# Patient Record
Sex: Female | Born: 1935 | Race: White | Hispanic: No | Smoking: Former smoker
Health system: Southern US, Community
[De-identification: ages and names within clinical notes are randomized; demographics above are authoritative.]

## PROBLEM LIST (undated history)

## (undated) DIAGNOSIS — Z860100 Personal history of colon polyps, unspecified: Secondary | ICD-10-CM

## (undated) DIAGNOSIS — M4800 Spinal stenosis, site unspecified: Secondary | ICD-10-CM

## (undated) DIAGNOSIS — C801 Malignant (primary) neoplasm, unspecified: Secondary | ICD-10-CM

## (undated) DIAGNOSIS — Z66 Do not resuscitate: Secondary | ICD-10-CM

## (undated) DIAGNOSIS — E538 Deficiency of other specified B group vitamins: Secondary | ICD-10-CM

## (undated) DIAGNOSIS — I10 Essential (primary) hypertension: Secondary | ICD-10-CM

## (undated) DIAGNOSIS — I77810 Thoracic aortic ectasia: Secondary | ICD-10-CM

## (undated) HISTORY — DX: Malignant (primary) neoplasm, unspecified: C80.1

## (undated) HISTORY — DX: Personal history of colon polyps, unspecified: Z86.0100

## (undated) HISTORY — PX: EYE SURGERY: SHX253

## (undated) HISTORY — PX: SPINE SURGERY: SHX786

## (undated) HISTORY — DX: Essential (primary) hypertension: I10

## (undated) HISTORY — DX: Do not resuscitate: Z66

## (undated) HISTORY — DX: Thoracic aortic ectasia: I77.810

## (undated) HISTORY — DX: Personal history of colonic polyps: Z86.010

## (undated) HISTORY — DX: Spinal stenosis, site unspecified: M48.00

## (undated) HISTORY — DX: Deficiency of other specified B group vitamins: E53.8

---
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
## 1968-09-09 HISTORY — PX: BUNIONECTOMY: SHX129

## 1989-09-09 HISTORY — PX: BREAST SURGERY: SHX581

## 2004-09-09 DIAGNOSIS — I10 Essential (primary) hypertension: Secondary | ICD-10-CM

## 2004-09-09 HISTORY — DX: Essential (primary) hypertension: I10

## 2006-08-13 ENCOUNTER — Ambulatory Visit: Payer: Self-pay | Admitting: Ophthalmology

## 2006-08-20 ENCOUNTER — Ambulatory Visit: Payer: Self-pay | Admitting: Ophthalmology

## 2007-02-25 DIAGNOSIS — G2581 Restless legs syndrome: Secondary | ICD-10-CM

## 2007-03-10 ENCOUNTER — Ambulatory Visit: Payer: Self-pay | Admitting: Internal Medicine

## 2007-03-17 ENCOUNTER — Ambulatory Visit: Payer: Self-pay | Admitting: Internal Medicine

## 2007-03-23 ENCOUNTER — Encounter (INDEPENDENT_AMBULATORY_CARE_PROVIDER_SITE_OTHER): Payer: Self-pay | Admitting: *Deleted

## 2007-03-30 ENCOUNTER — Emergency Department: Payer: Self-pay | Admitting: Emergency Medicine

## 2007-04-10 ENCOUNTER — Ambulatory Visit: Payer: Self-pay | Admitting: Internal Medicine

## 2007-04-14 ENCOUNTER — Ambulatory Visit: Payer: Self-pay | Admitting: Internal Medicine

## 2007-04-14 DIAGNOSIS — Z853 Personal history of malignant neoplasm of breast: Secondary | ICD-10-CM

## 2007-04-14 DIAGNOSIS — D518 Other vitamin B12 deficiency anemias: Secondary | ICD-10-CM | POA: Insufficient documentation

## 2007-04-14 DIAGNOSIS — I10 Essential (primary) hypertension: Secondary | ICD-10-CM | POA: Insufficient documentation

## 2007-04-14 DIAGNOSIS — Z860100 Personal history of colon polyps, unspecified: Secondary | ICD-10-CM | POA: Insufficient documentation

## 2007-04-19 DIAGNOSIS — R32 Unspecified urinary incontinence: Secondary | ICD-10-CM | POA: Insufficient documentation

## 2007-04-19 DIAGNOSIS — H409 Unspecified glaucoma: Secondary | ICD-10-CM | POA: Insufficient documentation

## 2007-04-22 ENCOUNTER — Telehealth (INDEPENDENT_AMBULATORY_CARE_PROVIDER_SITE_OTHER): Payer: Self-pay | Admitting: *Deleted

## 2007-04-23 ENCOUNTER — Telehealth (INDEPENDENT_AMBULATORY_CARE_PROVIDER_SITE_OTHER): Payer: Self-pay | Admitting: *Deleted

## 2007-04-29 ENCOUNTER — Telehealth (INDEPENDENT_AMBULATORY_CARE_PROVIDER_SITE_OTHER): Payer: Self-pay | Admitting: *Deleted

## 2007-04-30 ENCOUNTER — Encounter: Payer: Self-pay | Admitting: Internal Medicine

## 2007-05-06 ENCOUNTER — Encounter (INDEPENDENT_AMBULATORY_CARE_PROVIDER_SITE_OTHER): Payer: Self-pay | Admitting: *Deleted

## 2007-05-11 ENCOUNTER — Ambulatory Visit: Payer: Self-pay | Admitting: Internal Medicine

## 2007-05-12 ENCOUNTER — Encounter: Payer: Self-pay | Admitting: Internal Medicine

## 2007-05-15 ENCOUNTER — Telehealth (INDEPENDENT_AMBULATORY_CARE_PROVIDER_SITE_OTHER): Payer: Self-pay | Admitting: *Deleted

## 2007-05-15 ENCOUNTER — Encounter: Payer: Self-pay | Admitting: Internal Medicine

## 2007-05-20 ENCOUNTER — Ambulatory Visit: Payer: Self-pay | Admitting: *Deleted

## 2007-05-20 ENCOUNTER — Encounter: Payer: Self-pay | Admitting: Internal Medicine

## 2007-05-27 ENCOUNTER — Telehealth (INDEPENDENT_AMBULATORY_CARE_PROVIDER_SITE_OTHER): Payer: Self-pay | Admitting: *Deleted

## 2007-05-28 ENCOUNTER — Encounter: Payer: Self-pay | Admitting: Internal Medicine

## 2007-05-28 ENCOUNTER — Telehealth (INDEPENDENT_AMBULATORY_CARE_PROVIDER_SITE_OTHER): Payer: Self-pay | Admitting: *Deleted

## 2007-06-25 ENCOUNTER — Encounter: Payer: Self-pay | Admitting: Internal Medicine

## 2007-07-11 ENCOUNTER — Ambulatory Visit: Payer: Self-pay | Admitting: Internal Medicine

## 2007-07-22 ENCOUNTER — Ambulatory Visit: Payer: Self-pay | Admitting: *Deleted

## 2007-07-28 ENCOUNTER — Ambulatory Visit: Payer: Self-pay | Admitting: Internal Medicine

## 2007-07-28 ENCOUNTER — Encounter (INDEPENDENT_AMBULATORY_CARE_PROVIDER_SITE_OTHER): Payer: Self-pay | Admitting: *Deleted

## 2007-08-10 ENCOUNTER — Ambulatory Visit: Payer: Self-pay | Admitting: Internal Medicine

## 2007-08-17 ENCOUNTER — Encounter: Payer: Self-pay | Admitting: Internal Medicine

## 2007-10-21 ENCOUNTER — Ambulatory Visit: Payer: Self-pay | Admitting: Internal Medicine

## 2007-10-24 LAB — CONVERTED CEMR LAB: OCCULT 3: NEGATIVE

## 2007-10-26 LAB — CONVERTED CEMR LAB: OCCULT 2: NEGATIVE

## 2007-10-29 ENCOUNTER — Ambulatory Visit: Payer: Self-pay | Admitting: Internal Medicine

## 2007-12-17 ENCOUNTER — Telehealth (INDEPENDENT_AMBULATORY_CARE_PROVIDER_SITE_OTHER): Payer: Self-pay | Admitting: *Deleted

## 2008-02-18 ENCOUNTER — Ambulatory Visit: Payer: Self-pay | Admitting: Family Medicine

## 2008-02-18 DIAGNOSIS — M79609 Pain in unspecified limb: Secondary | ICD-10-CM

## 2008-05-10 DIAGNOSIS — M4800 Spinal stenosis, site unspecified: Secondary | ICD-10-CM

## 2008-05-10 HISTORY — DX: Spinal stenosis, site unspecified: M48.00

## 2008-05-13 ENCOUNTER — Encounter: Payer: Self-pay | Admitting: Family Medicine

## 2008-05-13 ENCOUNTER — Inpatient Hospital Stay: Payer: Self-pay | Admitting: Internal Medicine

## 2008-05-14 ENCOUNTER — Encounter: Payer: Self-pay | Admitting: Family Medicine

## 2008-05-15 ENCOUNTER — Encounter: Payer: Self-pay | Admitting: Family Medicine

## 2008-05-17 ENCOUNTER — Telehealth: Payer: Self-pay | Admitting: Family Medicine

## 2008-05-17 ENCOUNTER — Ambulatory Visit: Payer: Self-pay | Admitting: Family Medicine

## 2008-05-17 DIAGNOSIS — M4800 Spinal stenosis, site unspecified: Secondary | ICD-10-CM

## 2008-05-17 DIAGNOSIS — N259 Disorder resulting from impaired renal tubular function, unspecified: Secondary | ICD-10-CM | POA: Insufficient documentation

## 2008-05-17 LAB — CONVERTED CEMR LAB
Basophils Absolute: 0 10*3/uL (ref 0.0–0.1)
Basophils Relative: 0.3 % (ref 0.0–3.0)
CO2: 21 meq/L (ref 19–32)
Casts: 0 /lpf
Chloride: 108 meq/L (ref 96–112)
Creatinine, Ser: 2 mg/dL — ABNORMAL HIGH (ref 0.4–1.2)
Eosinophils Absolute: 0.1 10*3/uL (ref 0.0–0.7)
GFR calc Af Amer: 31 mL/min
GFR calc non Af Amer: 25 mL/min
Lymphocytes Relative: 4.6 % — ABNORMAL LOW (ref 12.0–46.0)
MCHC: 35.1 g/dL (ref 30.0–36.0)
Neutrophils Relative %: 84.8 % — ABNORMAL HIGH (ref 43.0–77.0)
Nitrite: NEGATIVE
Phosphorus: 4.6 mg/dL (ref 2.3–4.6)
Platelets: 185 10*3/uL (ref 150–400)
Potassium: 3.3 meq/L — ABNORMAL LOW (ref 3.5–5.1)
RBC: 3.53 M/uL — ABNORMAL LOW (ref 3.87–5.11)
Sodium: 140 meq/L (ref 135–145)
Specific Gravity, Urine: 1.015
Urine crystals, microscopic: 0 /hpf
Urobilinogen, UA: 0.2

## 2008-05-18 ENCOUNTER — Encounter: Payer: Self-pay | Admitting: Family Medicine

## 2008-05-18 DIAGNOSIS — M899 Disorder of bone, unspecified: Secondary | ICD-10-CM | POA: Insufficient documentation

## 2008-05-18 DIAGNOSIS — M949 Disorder of cartilage, unspecified: Secondary | ICD-10-CM

## 2008-05-23 ENCOUNTER — Inpatient Hospital Stay: Payer: Self-pay | Admitting: Internal Medicine

## 2008-05-23 ENCOUNTER — Telehealth: Payer: Self-pay | Admitting: Family Medicine

## 2008-05-30 ENCOUNTER — Encounter: Payer: Self-pay | Admitting: Family Medicine

## 2008-06-18 ENCOUNTER — Ambulatory Visit: Payer: Self-pay | Admitting: Family Medicine

## 2008-06-22 ENCOUNTER — Ambulatory Visit: Payer: Self-pay | Admitting: Pain Medicine

## 2008-06-30 ENCOUNTER — Encounter: Payer: Self-pay | Admitting: Family Medicine

## 2008-06-30 ENCOUNTER — Ambulatory Visit: Payer: Self-pay | Admitting: Pain Medicine

## 2008-07-01 ENCOUNTER — Telehealth: Payer: Self-pay | Admitting: Family Medicine

## 2008-07-07 ENCOUNTER — Ambulatory Visit: Payer: Self-pay | Admitting: Family Medicine

## 2008-07-11 ENCOUNTER — Telehealth: Payer: Self-pay | Admitting: Family Medicine

## 2008-07-14 ENCOUNTER — Telehealth: Payer: Self-pay | Admitting: Family Medicine

## 2008-07-15 ENCOUNTER — Telehealth: Payer: Self-pay | Admitting: Family Medicine

## 2008-07-18 ENCOUNTER — Telehealth: Payer: Self-pay | Admitting: Family Medicine

## 2008-07-20 ENCOUNTER — Ambulatory Visit: Payer: Self-pay | Admitting: Family Medicine

## 2008-07-22 ENCOUNTER — Encounter: Payer: Self-pay | Admitting: Family Medicine

## 2008-07-25 ENCOUNTER — Ambulatory Visit: Payer: Self-pay | Admitting: Physician Assistant

## 2008-08-08 ENCOUNTER — Telehealth: Payer: Self-pay | Admitting: Family Medicine

## 2008-08-23 ENCOUNTER — Telehealth: Payer: Self-pay | Admitting: Family Medicine

## 2008-08-30 ENCOUNTER — Telehealth: Payer: Self-pay | Admitting: Family Medicine

## 2008-09-13 ENCOUNTER — Encounter: Payer: Self-pay | Admitting: Family Medicine

## 2008-09-14 ENCOUNTER — Telehealth: Payer: Self-pay | Admitting: Family Medicine

## 2008-09-27 ENCOUNTER — Telehealth: Payer: Self-pay | Admitting: Family Medicine

## 2008-09-27 ENCOUNTER — Ambulatory Visit: Payer: Self-pay | Admitting: Family Medicine

## 2008-09-30 ENCOUNTER — Telehealth: Payer: Self-pay | Admitting: Family Medicine

## 2008-10-21 ENCOUNTER — Telehealth: Payer: Self-pay | Admitting: Family Medicine

## 2008-11-03 ENCOUNTER — Encounter (INDEPENDENT_AMBULATORY_CARE_PROVIDER_SITE_OTHER): Payer: Self-pay | Admitting: *Deleted

## 2008-11-11 ENCOUNTER — Telehealth: Payer: Self-pay | Admitting: Family Medicine

## 2008-11-14 ENCOUNTER — Encounter: Payer: Self-pay | Admitting: Family Medicine

## 2008-11-18 ENCOUNTER — Encounter: Payer: Self-pay | Admitting: Family Medicine

## 2008-11-28 ENCOUNTER — Ambulatory Visit: Payer: Self-pay | Admitting: Family Medicine

## 2008-11-29 ENCOUNTER — Telehealth: Payer: Self-pay | Admitting: Family Medicine

## 2008-12-05 ENCOUNTER — Telehealth: Payer: Self-pay | Admitting: Family Medicine

## 2008-12-16 ENCOUNTER — Telehealth: Payer: Self-pay | Admitting: Family Medicine

## 2008-12-22 ENCOUNTER — Encounter (INDEPENDENT_AMBULATORY_CARE_PROVIDER_SITE_OTHER): Payer: Self-pay | Admitting: *Deleted

## 2008-12-24 ENCOUNTER — Encounter: Payer: Self-pay | Admitting: Family Medicine

## 2008-12-28 ENCOUNTER — Ambulatory Visit: Payer: Self-pay | Admitting: Family Medicine

## 2008-12-28 LAB — CONVERTED CEMR LAB
AST: 21 units/L
Albumin: 40 g/dL
Alkaline Phosphatase: 98 units/L
BUN: 36 mg/dL
CO2: 19 meq/L
Cholesterol: 183 mg/dL
Glucose, Bld: 88 mg/dL
HDL: 73 mg/dL
Triglycerides: 60 mg/dL

## 2009-01-17 ENCOUNTER — Telehealth: Payer: Self-pay | Admitting: Family Medicine

## 2009-01-18 ENCOUNTER — Ambulatory Visit: Payer: Self-pay | Admitting: Family Medicine

## 2009-01-27 ENCOUNTER — Telehealth: Payer: Self-pay | Admitting: Family Medicine

## 2009-01-27 ENCOUNTER — Ambulatory Visit: Payer: Self-pay | Admitting: Family Medicine

## 2009-02-03 ENCOUNTER — Telehealth: Payer: Self-pay | Admitting: Family Medicine

## 2009-02-07 ENCOUNTER — Telehealth: Payer: Self-pay | Admitting: Family Medicine

## 2009-02-21 ENCOUNTER — Telehealth: Payer: Self-pay | Admitting: Family Medicine

## 2009-02-27 ENCOUNTER — Encounter: Payer: Self-pay | Admitting: Family Medicine

## 2009-03-06 ENCOUNTER — Ambulatory Visit: Payer: Self-pay | Admitting: Family Medicine

## 2009-03-06 LAB — CONVERTED CEMR LAB
Ketones, urine, test strip: NEGATIVE
Nitrite: POSITIVE
Protein, U semiquant: 30
Specific Gravity, Urine: 1.02
pH: 6

## 2009-03-10 LAB — CONVERTED CEMR LAB
ALT: 12 units/L (ref 0–35)
AST: 18 units/L (ref 0–37)
Alkaline Phosphatase: 64 units/L (ref 39–117)
Basophils Relative: 0.8 % (ref 0.0–3.0)
Bilirubin, Direct: 0.1 mg/dL (ref 0.0–0.3)
Chloride: 113 meq/L — ABNORMAL HIGH (ref 96–112)
Eosinophils Absolute: 0.2 10*3/uL (ref 0.0–0.7)
GFR calc non Af Amer: 45.67 mL/min (ref >60)
Lymphocytes Relative: 20.4 % (ref 12.0–46.0)
MCHC: 34.1 g/dL (ref 30.0–36.0)
MCV: 88.5 fL (ref 78.0–100.0)
Monocytes Absolute: 0.6 10*3/uL (ref 0.1–1.0)
Neutrophils Relative %: 66.6 % (ref 43.0–77.0)
Platelets: 164 10*3/uL (ref 150.0–400.0)
Potassium: 4.6 meq/L (ref 3.5–5.1)
RBC: 3.64 M/uL — ABNORMAL LOW (ref 3.87–5.11)
Sodium: 146 meq/L — ABNORMAL HIGH (ref 135–145)
Total Bilirubin: 0.9 mg/dL (ref 0.3–1.2)
WBC: 6.8 10*3/uL (ref 4.5–10.5)

## 2009-03-11 ENCOUNTER — Encounter: Payer: Self-pay | Admitting: Family Medicine

## 2009-03-15 ENCOUNTER — Encounter: Payer: Self-pay | Admitting: Family Medicine

## 2009-03-28 ENCOUNTER — Telehealth: Payer: Self-pay | Admitting: Family Medicine

## 2009-04-04 ENCOUNTER — Ambulatory Visit: Payer: Self-pay | Admitting: Family Medicine

## 2009-04-05 ENCOUNTER — Encounter: Payer: Self-pay | Admitting: Family Medicine

## 2009-04-06 ENCOUNTER — Telehealth: Payer: Self-pay | Admitting: Family Medicine

## 2009-04-07 ENCOUNTER — Telehealth: Payer: Self-pay | Admitting: Family Medicine

## 2009-04-10 ENCOUNTER — Encounter: Payer: Self-pay | Admitting: Family Medicine

## 2009-04-12 ENCOUNTER — Telehealth: Payer: Self-pay | Admitting: Family Medicine

## 2009-05-19 ENCOUNTER — Encounter: Payer: Self-pay | Admitting: Family Medicine

## 2009-05-26 ENCOUNTER — Encounter: Payer: Self-pay | Admitting: Family Medicine

## 2009-07-06 ENCOUNTER — Ambulatory Visit: Payer: Self-pay | Admitting: Family Medicine

## 2009-10-23 ENCOUNTER — Telehealth: Payer: Self-pay | Admitting: Family Medicine

## 2010-02-09 ENCOUNTER — Ambulatory Visit: Payer: Self-pay | Admitting: Family Medicine

## 2010-02-09 DIAGNOSIS — J309 Allergic rhinitis, unspecified: Secondary | ICD-10-CM | POA: Insufficient documentation

## 2010-02-09 LAB — CONVERTED CEMR LAB
ALT: 11 units/L (ref 0–35)
AST: 14 units/L (ref 0–37)
Albumin: 3.9 g/dL (ref 3.5–5.2)
Alkaline Phosphatase: 56 units/L (ref 39–117)
BUN: 34 mg/dL — ABNORMAL HIGH (ref 6–23)
CO2: 25 meq/L (ref 19–32)
Chloride: 114 meq/L — ABNORMAL HIGH (ref 96–112)
GFR calc non Af Amer: 38.78 mL/min (ref >60)
Glucose, Bld: 88 mg/dL (ref 70–99)
Potassium: 4.8 meq/L (ref 3.5–5.1)
Sodium: 146 meq/L — ABNORMAL HIGH (ref 135–145)

## 2010-02-13 DIAGNOSIS — E559 Vitamin D deficiency, unspecified: Secondary | ICD-10-CM | POA: Insufficient documentation

## 2010-05-23 ENCOUNTER — Ambulatory Visit: Payer: Self-pay | Admitting: Family Medicine

## 2010-07-25 ENCOUNTER — Telehealth: Payer: Self-pay | Admitting: Family Medicine

## 2010-08-10 ENCOUNTER — Ambulatory Visit: Payer: Self-pay | Admitting: Family Medicine

## 2010-10-09 ENCOUNTER — Encounter: Payer: Self-pay | Admitting: Family Medicine

## 2010-10-10 NOTE — Progress Notes (Signed)
Summary: Rx Lomotil  Phone Note Refill Request Call back at [phone] Message from:  Ashley Conner on October 23, 2009 9:14 AM  Refills Requested: Medication #1:  LOMOTIL 2.5-0.025 MG TABS 1 tab daily as needed diarrhea. Limit use as much as possible.   Last Refilled: 09/05/2008 Received faxed refill request, spoke with patient and she says she does need this refilled.  Please advise   Method Requested: Electronic Initial call taken by: Linde Gillis CMA Duncan Dull),  October 23, 2009 9:15 AM  Follow-up for Phone Call        Make sure patient knows that this needs to be used in limited fashion. If diarrhea continuing...needs to be seen.  Follow-up by: Kerby Nora MD,  October 23, 2009 11:00 AM  Additional Follow-up for Phone Call Additional follow up Details #1::        rx called to pharmacy Additional Follow-up by: Benny Lennert CMA (AAMA),  October 23, 2009 2:12 PM    Prescriptions: LOMOTIL 2.5-0.025 MG TABS (DIPHENOXYLATE-ATROPINE) 1 tab daily as needed diarrhea. Limit use as much as possible.  #20 x 0   Entered and Authorized by:   Kerby Nora MD   Signed by:   Kerby Nora MD on 10/23/2009   Method used:   Telephoned to ...       [facility] [address]* (retail)       [address] [place], [place]  [address]       Ph: [phone]       Fax: [phone]   RxID:   [id number]

## 2010-10-10 NOTE — Assessment & Plan Note (Signed)
Summary: ?SINUS INFECTION/CLE   Vital Signs:  Patient profile:   76 year old female Weight:      119.25 pounds Temp:     98.2 degrees F oral Pulse rate:   72 / minute Pulse rhythm:   regular BP sitting:   162 / 98  (left arm) Cuff size:   regular  Vitals Entered By: Selena Batten Dance CMA Duncan Dull) (May 23, 2010 11:41 AM) CC: ? sinus infection/right facial swelling   History of Present Illness: CC: sinus infection?  Presents with daughter who helps with history.  2wk h/o sinus drainage.  Also congested.  h/o eustachian tube dysfunction.  Voice deeper, hoarse.  + RN, clear mucous discharge.  Has tried zyrtec.  At night does better.  Aggravated mainly by constant dripping.  No ST, no fevers/chills, HA, pressure pain, no PNdrip per patient.  No cough, SOB. no trouble opening/closing mouth, no drooling.  + viruses running around retirement home.  No smokers at home.  + h/o allergies  Allergies: 1)  ! Penicillin V Potassium (Penicillin V Potassium) 2)  ! Sulfa 3)  ! Codeine Sulfate (Codeine Sulfate) 4)  ! Keflex (Cephalexin)  Past History:  Past Medical History: Last updated: 05/17/2008 Breast cancer, hx of Colonic polyps, hx of Hypertension-2006 Urinary incontinence spinal stenosis with herniated disc 9/09 PMH-FH-SH reviewed for relevance  Review of Systems       per HPI  Physical Exam  General:  elderly female in NAD, kyphosis Head:  Normocephalic and atraumatic without obvious abnormalities. No apparent alopecia or balding.  no sinus tenderness.  no facial swelling. Eyes:  No corneal or conjunctival inflammation noted. EOMI. Perrla. Ears:  clear fluid B TMS Nose:  nasal discharge, mucosal pallor.    Mouth:  MMM, no pharyngeal erythema.  + some PND Neck:  no carotid bruit or thyromegaly no cervical or supraclavicular lymphadenopathy  Lungs:  Normal respiratory effort, chest expands symmetrically. Lungs are clear to auscultation, no crackles or wheezes. Heart:   SEM Pulses:  2+ rad pulses Extremities:  1+ left pedal edema and 2+ right pedal edema.   Skin:  Intact without suspicious lesions or rashes   Impression & Recommendations:  Problem # 1:  OTHER ACUTE SINUSITIS (ICD-461.8) Sounds viral vs vasomotor.  Rec trial of different oral antihistamine and nasal steroid (zyrtec too sedating, flonase not helping) and nasal saline.  Daughter doesn't think would tolerate neti pot.  could try astepro if not improved with these measures.  would need to make sure pt not taking decongestants currently (to r/o medicamentosa).  however as going on for 2 wks and given pt age, treat with course of abx.  The following medications were removed from the medication list:    Fluticasone Propionate 50 Mcg/act Susp (Fluticasone propionate) .Marland Kitchen... 2 sprays per nostril daily Her updated medication list for this problem includes:    Zithromax Z-pak 250 Mg Tabs (Azithromycin) ..... Use as directed    Nasonex 50 Mcg/act Susp (Mometasone furoate) .Marland Kitchen... 2 sprays in each nostril daily, point to ears.  Problem # 2:  ALLERGIC RHINITIS (ICD-477.9) see above. The following medications were removed from the medication list:    Fluticasone Propionate 50 Mcg/act Susp (Fluticasone propionate) .Marland Kitchen... 2 sprays per nostril daily Her updated medication list for this problem includes:    Claritin 10 Mg Tabs (Loratadine) ..... One daily for allergies    Nasonex 50 Mcg/act Susp (Mometasone furoate) .Marland Kitchen... 2 sprays in each nostril daily, point to ears.  Complete Medication List:  1)  Metoprolol Tartrate 25 Mg Tabs (Metoprolol tartrate) .Marland Kitchen.. 1 tab by mouth two times a day 2)  Azopt 1 % Susp (Brinzolamide) .... Three times a day ou 3)  Xalatan 0.005 % Soln (Latanoprost) .... At bedtime ou 4)  Compression Hose 15-30 Mmhg  .... Wear as directed 5)  Gabapentin 100 Mg Caps (Gabapentin) .... Take 1 tablet by mouth every morning 6)  Alendronate Sodium 70 Mg Tabs (Alendronate sodium) .... Take 1 tablet  by mouth once week 7)  Lomotil 2.5-0.025 Mg Tabs (Diphenoxylate-atropine) .Marland Kitchen.. 1 tab daily as needed diarrhea. limit use as much as possible. 8)  Amlodipine Besylate 5 Mg Tabs (Amlodipine besylate) .Marland Kitchen.. 1 tab by mouth daily 9)  Lisinopril 40 Mg Tabs (Lisinopril) .... Take 1/2 tablet by mouth two times a day 10)  Vitamin B-12 1000 Mcg Tabs (Cyanocobalamin) .Marland Kitchen.. 1 tab by mouth daily. 11)  Claritin 10 Mg Tabs (Loratadine) .... One daily for allergies 12)  Vitamin D (ergocalciferol) 50000 Unit Caps (Ergocalciferol) .Marland Kitchen.. 1 tab by mouth weekly x 12 weeks then monthly 13)  Zithromax Z-pak 250 Mg Tabs (Azithromycin) .... Use as directed 14)  Nasonex 50 Mcg/act Susp (Mometasone furoate) .... 2 sprays in each nostril daily, point to ears.  Patient Instructions: 1)  I think you have large amount of congestion, possible early sinus infection. 2)  Treat with course of antibiotics, nasal steroid, and nasal saline drops with plenty of fluids.  Change antihistamine. 3)  Let us know how you are doing. 4)  Please return if you have high fever >101.5, or trouble swallowing or drooling or opening mouth. 5)  Pleasure to meet you today.  Call clinic with quesitons. Prescriptions: NASONEX 50 MCG/ACT SUSP (MOMETASONE FUROATE) 2 sprays in each nostril daily, point to ears.  #1 x 3   Entered and Authorized by:   Eustaquio Boyden  MD   Signed by:   Eustaquio Boyden  MD on 05/23/2010   Method used:   Electronically to        [facility] [address]* (retail)       [address] [place], [place]  [address]       Ph: [phone]       Fax: [phone]   RxID:   [id number] CLARITIN 10 MG TABS (LORATADINE) one daily for allergies  #30 x 3   Entered and Authorized by:   Eustaquio Boyden  MD   Signed by:   Eustaquio Boyden  MD on 05/23/2010   Method used:   Historical   RxID:   [id number] ZITHROMAX Z-PAK 250 MG TABS (AZITHROMYCIN) use as directed  #1 x 0   Entered and  Authorized by:   Eustaquio Boyden  MD   Signed by:   Eustaquio Boyden  MD on 05/23/2010   Method used:   Electronically to        [facility] [address] (retail)       [address] [place], [place]  [address]       Ph: [phone]       Fax: [phone]   RxID:   [id number]   Current Allergies (reviewed today): ! PENICILLIN V POTASSIUM (PENICILLIN V POTASSIUM) ! SULFA ! CODEINE SULFATE (CODEINE SULFATE) ! KEFLEX (CEPHALEXIN)

## 2010-10-10 NOTE — Assessment & Plan Note (Signed)
Summary: F/U/CLE   Vital Signs:  Patient profile:   77 year old female Height:      61 inches Weight:      121.2 pounds BMI:     22.98 Temp:     98.2 degrees F oral Pulse rate:   78 / minute Pulse rhythm:   regular BP sitting:   150 / 80  (left arm) Cuff size:   regular  Vitals Entered By: Benny Lennert CMA Duncan Dull) (February 09, 2010 12:09 PM)  History of Present Illness: Chief complaint follow up  Doing well overall. No current falls.   Congestion..not taking zyrtec regularly.  Ears feel full, no pain.  no fever. No siunus pain.   Hypertension History:      She denies headache, chest pain, palpitations, dyspnea with exertion, orthopnea, peripheral edema, syncope, and side effects from treatment.  Well controlled at home. Marland Kitchen        Positive major cardiovascular risk factors include female age 2 years old or older and hypertension.        Positive history for target organ damage include renal insufficiency.     Problems Prior to Update: 1)  Shoulder Pain, Right  (ICD-719.41) 2)  Osteopenia  (ICD-733.90) 3)  Renal Insufficiency  (ICD-588.9) 4)  Spinal Stenosis  (ICD-724.00) 5)  Leg Pain, Bilateral  (ICD-729.5) 6)  Special Screening Malig Neoplasms Other Sites  (ICD-V76.49) 7)  Need Proph Vacc Against Hemophilus Flu Type B  (ICD-V03.81) 8)  Urinary Incontinence  (ICD-788.30) 9)  Diarrhea  (ICD-787.91) 10)  Weight Loss  (ICD-783.21) 11)  Hay Fever  (ICD-477.0) 12)  Restless Leg Syndrome  (ICD-333.94) 13)  Glaucoma  (ICD-365.9) 14)  Anemia, Vitamin B12 Deficiency Nec  (ICD-281.1) 15)  Hypertension  (ICD-401.9) 16)  Colonic Polyps, Hx of  (ICD-V12.72) 17)  Breast Cancer, Hx of  (ICD-V10.3)  Current Medications (verified): 1)  Metoprolol Tartrate 25 Mg Tabs (Metoprolol Tartrate) .Marland Kitchen.. 1 Tab By Mouth Two Times A Day 2)  Azopt 1 %  Susp (Brinzolamide) .... Three Times A Day Ou 3)  Xalatan 0.005 %  Soln (Latanoprost) .... At Bedtime Ou 4)  Compression Hose  15-30 Mmhg ....  Wear As Directed 5)  Gabapentin 100 Mg Caps (Gabapentin) .... Take 1 Tablet By Mouth Every Morning 6)  Alendronate Sodium 70 Mg Tabs (Alendronate Sodium) .... Take 1 Tablet By Mouth Once Week 7)  Lomotil 2.5-0.025 Mg Tabs (Diphenoxylate-Atropine) .Marland Kitchen.. 1 Tab Daily As Needed Diarrhea. Limit Use As Much As Possible. 8)  Amlodipine Besylate 5 Mg Tabs (Amlodipine Besylate) .Marland Kitchen.. 1 Tab By Mouth Daily 9)  Lisinopril 40 Mg Tabs (Lisinopril) .... Take 1 Tablet By Mouth Once A Day 10)  Vitamin B-12 1000 Mcg Tabs (Cyanocobalamin) .Marland Kitchen.. 1 Tab By Mouth Daily. 11)  Fluticasone Propionate 50 Mcg/act Susp (Fluticasone Propionate) .... 2 Sprays Per Nostril Daily 12)  Zyrtec Hives Relief 10 Mg Tabs (Cetirizine Hcl) .Marland Kitchen.. 1 Tab By Mouth Daily  Allergies: 1)  ! Penicillin V Potassium (Penicillin V Potassium) 2)  ! Sulfa 3)  ! Codeine Sulfate (Codeine Sulfate) 4)  ! Keflex (Cephalexin)  Past History:  Past medical, surgical, family and social histories (including risk factors) reviewed, and no changes noted (except as noted below).  Past Medical History: Reviewed history from 05/17/2008 and no changes required. Breast cancer, hx of Colonic polyps, hx of Hypertension-2006 Urinary incontinence spinal stenosis with herniated disc 9/09  Past Surgical History: Reviewed history from 05/17/2008 and no changes required. Appendectomy- 1937 Cataract  extraction- OS, 2003, OD- 08/2006 Cholecystectomy- 1958 Mastectomy,right- 1991 Tonsillectomy- 1932 Bunionectomy, left- 1970's Tow back surgeries after 1972-74 Exploratory lap, ruptured obturator muscle- 1997 hosp [facility]- spinal stenosis and uti 9/09  Family History: Reviewed history from 04/14/2007 and no changes required. No family history secondary to patient being adopted.  Social History: Reviewed history from 05/17/2008 and no changes required. Occupation:retired teacher non smoker  lives at [facility]   Review of Systems General:   Complains of fatigue; denies fever. CV:  Denies chest pain or discomfort. Resp:  Denies shortness of breath. GI:  Denies abdominal pain. GU:  Denies dysuria.  Physical Exam  General:  elderly female in NAD, kyphosis Head:  no maxillary sinus ttp Eyes:  No corneal or conjunctival inflammation noted. EOMI. Perrla. Funduscopic exam benign, without hemorrhages, exudates or papilledema. Vision grossly normal. Ears:  clear fluid B TMS Nose:  nasal discharge, mucosal pallor.   Decreased hearing B Mouth:  MMM Neck:  no carotid bruit or thyromegaly no cervical or supraclavicular lymphadenopathy  Lungs:  Normal respiratory effort, chest expands symmetrically. Lungs are clear to auscultation, no crackles or wheezes. Heart:  Normal rate and regular rhythm. S1 and S2 normal without gallop, murmur, click, rub or other extra sounds. Abdomen:  Bowel sounds positive,abdomen soft and non-tender without masses, organomegaly or hernias noted. Pulses:  R and L posterior tibial pulses are full and equal bilaterally  Extremities:  1+ left pedal edema and 2+ right pedal edema.   Psych:  Cognition and judgment appear intact. Alert and cooperative with normal attention span and concentration. No apparent delusions, illusions, hallucinations    Impression & Recommendations:  Problem # 1:  ALLERGIC RHINITIS (ICD-477.9) Poor control..eustacian tiube dysfunction. No clear infection. Treat woith oral antihistamines as well as nasl steroid.  Her updated medication list for this problem includes:    Fluticasone Propionate 50 Mcg/act Susp (Fluticasone propionate) .Marland Kitchen... 2 sprays per nostril daily    Zyrtec Hives Relief 10 Mg Tabs (Cetirizine hcl) .Marland Kitchen... 1 tab by mouth daily  Orders: Prescription Created Electronically [phone])  Problem # 2:  OSTEOPENIA (ICD-733.90) Due for reeval. vit D. On alendronate.  Her updated medication list for this problem includes:    Alendronate Sodium 70 Mg Tabs (Alendronate sodium)  .Marland Kitchen... Take 1 tablet by mouth once week  Orders: T-Vitamin D (25-Hydroxy) ([id number]) Specimen Handling (91478)  Problem # 3:  ANEMIA, VITAMIN B12 DEFICIENCY NEC (ICD-281.1) Due for reeval. Trouble taking pill...cut in half. Refuses injection.  Her updated medication list for this problem includes:    Vitamin B-12 1000 Mcg Tabs (Cyanocobalamin) .Marland Kitchen... 1 tab by mouth daily.  Orders: TLB-B12 + Folate Pnl (29562_13086-V78/ION)  Problem # 4:  RENAL INSUFFICIENCY (ICD-588.9) Due for reeval.   Problem # 5:  HYPERTENSION (ICD-401.9) Well controlled. Continue current medication.  Her updated medication list for this problem includes:    Metoprolol Tartrate 25 Mg Tabs (Metoprolol tartrate) .Marland Kitchen... 1 tab by mouth two times a day    Amlodipine Besylate 5 Mg Tabs (Amlodipine besylate) .Marland Kitchen... 1 tab by mouth daily    Lisinopril 40 Mg Tabs (Lisinopril) .Marland Kitchen... Take 1 tablet by mouth once a day  Orders: TLB-BMP (Basic Metabolic Panel-BMET) (80048-METABOL) TLB-Hepatic/Liver Function Pnl (80076-HEPATIC)  Complete Medication List: 1)  Metoprolol Tartrate 25 Mg Tabs (Metoprolol tartrate) .Marland Kitchen.. 1 tab by mouth two times a day 2)  Azopt 1 % Susp (Brinzolamide) .... Three times a day ou 3)  Xalatan 0.005 % Soln (Latanoprost) .... At bedtime  ou 4)  Compression Hose 15-30 Mmhg  .... Wear as directed 5)  Gabapentin 100 Mg Caps (Gabapentin) .... Take 1 tablet by mouth every morning 6)  Alendronate Sodium 70 Mg Tabs (Alendronate sodium) .... Take 1 tablet by mouth once week 7)  Lomotil 2.5-0.025 Mg Tabs (Diphenoxylate-atropine) .Marland Kitchen.. 1 tab daily as needed diarrhea. limit use as much as possible. 8)  Amlodipine Besylate 5 Mg Tabs (Amlodipine besylate) .Marland Kitchen.. 1 tab by mouth daily 9)  Lisinopril 40 Mg Tabs (Lisinopril) .... Take 1 tablet by mouth once a day 10)  Vitamin B-12 1000 Mcg Tabs (Cyanocobalamin) .Marland Kitchen.. 1 tab by mouth daily. 11)  Fluticasone Propionate 50 Mcg/act Susp (Fluticasone propionate) .... 2 sprays per  nostril daily 12)  Zyrtec Hives Relief 10 Mg Tabs (Cetirizine hcl) .Marland Kitchen.. 1 tab by mouth daily  Hypertension Assessment/Plan:      The patient's hypertensive risk group is category C: Target organ damage and/or diabetes.  Her calculated 10 year risk of coronary heart disease is 7 %.  Today's blood pressure is 150/80.  Her blood pressure goal is < 140/90.  Patient Instructions: 1)  Please schedule a follow-up appointment in 3 months 30 min.  Prescriptions: FLUTICASONE PROPIONATE 50 MCG/ACT SUSP (FLUTICASONE PROPIONATE) 2 sprays per nostril daily  #1 x 11   Entered and Authorized by:   Kerby Nora MD   Signed by:   Kerby Nora MD on 02/09/2010   Method used:   Electronically to        [facility]* (retail)       [address] [place], [place]  [address]       Ph: [phone]       Fax: [phone]   RxID:   [id number]   Current Allergies (reviewed today): ! PENICILLIN V POTASSIUM (PENICILLIN V POTASSIUM) ! SULFA ! CODEINE SULFATE (CODEINE SULFATE) ! KEFLEX (CEPHALEXIN)

## 2010-10-10 NOTE — Progress Notes (Signed)
Summary: refill request for gabapentin  Phone Note Refill Request Message from:  Fax from Pharmacy  Refills Requested: Medication #1:  GABAPENTIN 100 MG CAPS Take 1 tablet by mouth every morning   Last Refilled: 05/09/2010 Faxed request from [facility], pt gets quantity of 90.  Initial call taken by: Lowella Petties CMA, AAMA,  July 25, 2010 5:02 PM  Follow-up for Phone Call        filled. latest Cr 1.4 02/2010. Follow-up by: Eustaquio Boyden  MD,  July 25, 2010 5:14 PM    Prescriptions: GABAPENTIN 100 MG CAPS (GABAPENTIN) Take 1 tablet by mouth every morning  #90 x 3   Entered and Authorized by:   Eustaquio Boyden  MD   Signed by:   Eustaquio Boyden  MD on 07/25/2010   Method used:   Electronically to        [facility] (retail)       [address] [place], [place]  [address]       Ph: [phone]       Fax: [phone]   RxID:   [id number]

## 2010-10-11 NOTE — Assessment & Plan Note (Signed)
Summary: F/UP / LFW  R/S FROM 08/09/10   Vital Signs:  Patient profile:   77 year old female Height:      61 inches Weight:      118.8 pounds BMI:     22.53 Temp:     98.1 degrees F oral Pulse rate:   72 / minute Pulse rhythm:   regular BP sitting:   140 / 80  (left arm) Cuff size:   regular  Vitals Entered By: Benny Lennert CMA Duncan Dull) (August 10, 2010 11:48 AM)  History of Present Illness: Chief complaint 6 month follow up  Doing well overall since last OV. No falls.  No body pain.  Walking 1/3 mile every day... gets funny money from NH as reward.     Increase in urinary frequncy...at bedtime... 5-6 times a night....really last night per daughter.. no a long term issue. Usually sleeps well.  Not symptoms during the day. No dysuria.  No fever, no abdominal pain. NOT BOTHERSOME ENOUGH TO WANT TO TAKE ANOTHER MED>  Eat okay.. no depression.   Hypertension History:      She denies chest pain, dyspnea with exertion, and syncope.  Well controlled at home. Marland Kitchen        Positive major cardiovascular risk factors include female age 46 years old or older and hypertension.        Positive history for target organ damage include renal insufficiency.     Problems Prior to Update: 1)  Other Acute Sinusitis  (ICD-461.8) 2)  Unspecified Vitamin D Deficiency  (ICD-268.9) 3)  Allergic Rhinitis  (ICD-477.9) 4)  Shoulder Pain, Right  (ICD-719.41) 5)  Osteopenia  (ICD-733.90) 6)  Renal Insufficiency  (ICD-588.9) 7)  Spinal Stenosis  (ICD-724.00) 8)  Leg Pain, Bilateral  (ICD-729.5) 9)  Special Screening Malig Neoplasms Other Sites  (ICD-V76.49) 10)  Need Proph Vacc Against Hemophilus Flu Type B  (ICD-V03.81) 11)  Urinary Incontinence  (ICD-788.30) 12)  Diarrhea  (ICD-787.91) 13)  Weight Loss  (ICD-783.21) 14)  Hay Fever  (ICD-477.0) 15)  Restless Leg Syndrome  (ICD-333.94) 16)  Glaucoma  (ICD-365.9) 17)  Anemia, Vitamin B12 Deficiency Nec  (ICD-281.1) 18)  Hypertension   (ICD-401.9) 19)  Colonic Polyps, Hx of  (ICD-V12.72) 20)  Breast Cancer, Hx of  (ICD-V10.3)  Current Medications (verified): 1)  Metoprolol Tartrate 25 Mg Tabs (Metoprolol Tartrate) .Marland Kitchen.. 1 Tab By Mouth Two Times A Day 2)  Xalatan 0.005 %  Soln (Latanoprost) .... At Bedtime Ou 3)  Compression Hose  15-30 Mmhg .... Wear As Directed 4)  Gabapentin 100 Mg Caps (Gabapentin) .... Take 1 Tablet By Mouth Every Morning 5)  Alendronate Sodium 70 Mg Tabs (Alendronate Sodium) .... Take 1 Tablet By Mouth Once Week 6)  Lomotil 2.5-0.025 Mg Tabs (Diphenoxylate-Atropine) .Marland Kitchen.. 1 Tab Daily As Needed Diarrhea. Limit Use As Much As Possible. 7)  Amlodipine Besylate 5 Mg Tabs (Amlodipine Besylate) .Marland Kitchen.. 1 Tab By Mouth Daily 8)  Lisinopril 40 Mg Tabs (Lisinopril) .... Take 1/2 Tablet By Mouth Two Times A Day 9)  Vitamin B-12 1000 Mcg Tabs (Cyanocobalamin) .Marland Kitchen.. 1 Tab By Mouth Daily. 10)  Claritin 10 Mg Tabs (Loratadine) .... One Daily For Allergies 11)  Vitamin D (Ergocalciferol) 50000 Unit Caps (Ergocalciferol) .Marland Kitchen.. 1 Tab By Mouth Weekly X 12 Weeks Then Monthly 12)  Nasonex 50 Mcg/act Susp (Mometasone Furoate) .... 2 Sprays in Each Nostril Daily, Point To Ears. 13)  Cozopt  Allergies: 1)  ! Penicillin V Potassium (Penicillin V  Potassium) 2)  ! Sulfa 3)  ! Codeine Sulfate (Codeine Sulfate) 4)  ! Keflex (Cephalexin)  Past History:  Past medical, surgical, family and social histories (including risk factors) reviewed, and no changes noted (except as noted below).  Past Medical History: Reviewed history from 05/17/2008 and no changes required. Breast cancer, hx of Colonic polyps, hx of Hypertension-2006 Urinary incontinence spinal stenosis with herniated disc 9/09  Past Surgical History: Reviewed history from 05/17/2008 and no changes required. Appendectomy- 1937 Cataract extraction- OS, 2003, OD- 08/2006 Cholecystectomy- 1958 Mastectomy,right- 1991 Tonsillectomy- 1932 Bunionectomy, left-  1970's Tow back surgeries after 1972-74 Exploratory lap, ruptured obturator muscle- 1997 hosp [facility]- spinal stenosis and uti 9/09  Family History: Reviewed history from 04/14/2007 and no changes required. No family history secondary to patient being adopted.  Social History: Reviewed history from 05/17/2008 and no changes required. Occupation:retired teacher non smoker  lives at [facility]   Review of Systems General:  Denies fatigue. CV:  Denies chest pain or discomfort. Resp:  Denies shortness of breath. GI:  Denies abdominal pain. GU:  Denies hematuria.  Physical Exam  General:  elderly female in NAD, kyphosis Mouth:  MMM, no pharyngeal erythema.  + some PND Neck:  no carotid bruit or thyromegaly no cervical or supraclavicular lymphadenopathy  Lungs:  Normal respiratory effort, chest expands symmetrically. Lungs are clear to auscultation, no crackles or wheezes. Heart:  SEM Abdomen:  Bowel sounds positive,abdomen soft and non-tender without masses, organomegaly or hernias noted. Pulses:  2+ rad pulses Extremities:  1+ left pedal edema and 2+ right pedal edema.   Psych:  not anxious appearing and not depressed appearing.     Impression & Recommendations:  Problem # 1:  HYPERTENSION (ICD-401.9) Assessment Unchanged Well controlled. Continue current medication.  Her updated medication list for this problem includes:    Metoprolol Tartrate 25 Mg Tabs (Metoprolol tartrate) .Marland Kitchen... 1 tab by mouth two times a day    Amlodipine Besylate 5 Mg Tabs (Amlodipine besylate) .Marland Kitchen... 1 tab by mouth daily    Lisinopril 40 Mg Tabs (Lisinopril) .Marland Kitchen... Take 1/2 tablet by mouth two times a day  BP today: 140/80 Prior BP: 162/98 (05/23/2010)  Prior 10 Yr Risk Heart Disease: 7 % (04/04/2009)  Labs Reviewed: K+: 4.8 (02/09/2010) Creat: : 1.4 (02/09/2010)   Chol: 183 (12/28/2008)   HDL: 73 (12/28/2008)   LDL: 98 (12/28/2008)   TG: 60 (12/28/2008)  Problem # 2:  URINARY  INCONTINENCE (ICD-788.30) Assessment: Unchanged Not bothersome enough fo her to want to start another med.   Problem # 3:  UNSPECIFIED VITAMIN D DEFICIENCY (ICD-268.9) Assessment: Unchanged Will reeval before next appt.   Complete Medication List: 1)  Metoprolol Tartrate 25 Mg Tabs (Metoprolol tartrate) .Marland Kitchen.. 1 tab by mouth two times a day 2)  Xalatan 0.005 % Soln (Latanoprost) .... At bedtime ou 3)  Compression Hose 15-30 Mmhg  .... Wear as directed 4)  Gabapentin 100 Mg Caps (Gabapentin) .... Take 1 tablet by mouth every morning 5)  Alendronate Sodium 70 Mg Tabs (Alendronate sodium) .... Take 1 tablet by mouth once week 6)  Lomotil 2.5-0.025 Mg Tabs (Diphenoxylate-atropine) .Marland Kitchen.. 1 tab daily as needed diarrhea. limit use as much as possible. 7)  Amlodipine Besylate 5 Mg Tabs (Amlodipine besylate) .Marland Kitchen.. 1 tab by mouth daily 8)  Lisinopril 40 Mg Tabs (Lisinopril) .... Take 1/2 tablet by mouth two times a day 9)  Vitamin B-12 1000 Mcg Tabs (Cyanocobalamin) .Marland Kitchen.. 1 tab by mouth daily. 10)  Claritin 10 Mg Tabs (Loratadine) .... One daily for allergies 11)  Vitamin D (ergocalciferol) 50000 Unit Caps (Ergocalciferol) .Marland Kitchen.. 1 tab by mouth weekly x 12 weeks then monthly 12)  Nasonex 50 Mcg/act Susp (Mometasone furoate) .... 2 sprays in each nostril daily, point to ears. 13)  Cozopt   Hypertension Assessment/Plan:      The patient's hypertensive risk group is category C: Target organ damage and/or diabetes.  Her calculated 10 year risk of coronary heart disease is 7 %.  Today's blood pressure is 140/80.  Her blood pressure goal is < 140/90.  Patient Instructions: 1)  Please schedule a follow-up appointment in 6 months  CPX 30 min.  2)  Prior to appt check VIT D, cbc, CMET Dx 401.1, 733.90 3)  Start calcium and vit D 600 mg/400IU daily to twice....try chews.   Orders Added: 1)  Est. Patient Level IV [16109]    Current Allergies (reviewed today): ! PENICILLIN V POTASSIUM (PENICILLIN V  POTASSIUM) ! SULFA ! CODEINE SULFATE (CODEINE SULFATE) ! KEFLEX (CEPHALEXIN)

## 2010-10-31 NOTE — Letter (Signed)
Summary: [facility] Patient records  [facility] Patient records   Imported By: Kassie Mends 10/22/2010 11:10:32  _____________________________________________________________________  External Attachment:    Type:   Image     Comment:   External Document

## 2011-01-21 ENCOUNTER — Other Ambulatory Visit: Payer: Self-pay | Admitting: *Deleted

## 2011-01-21 MED ORDER — LISINOPRIL 40 MG PO TABS: 40.0000 mg | ORAL_TABLET | Freq: Every day | ORAL | Status: DC

## 2011-01-22 NOTE — Assessment & Plan Note (Signed)
[facility]                                 ON-CALL NOTE   NAME:Conner, Ashley                            MRN:          [id number]  DATE:04/25/2007                            DOB:          12/28/1924    Andi Devon called from [phone].  Patient of Dr. Alphonsus Sias.  Called saying  her B12 was supposed to be called in.  It was not called in.  They  called at 3:34 p.m. on April 25, 2007.  A refill of her B12 1,00 mcg  once weekly was called in for one month to [facility]  and  they would need to talk to the office on Monday to get further refills.     Lelon Perla, DO  Electronically Signed    Shawnie Dapper  DD: 04/25/2007  DT: 04/26/2007  Job #: [id number]   cc:   Karie Schwalbe, MD

## 2011-01-29 ENCOUNTER — Encounter: Payer: Self-pay | Admitting: Family Medicine

## 2011-01-29 ENCOUNTER — Emergency Department: Payer: Self-pay | Admitting: Unknown Physician Specialty

## 2011-01-29 LAB — HM MAMMOGRAPHY

## 2011-01-29 LAB — HM SIGMOIDOSCOPY

## 2011-01-30 ENCOUNTER — Ambulatory Visit: Payer: Self-pay | Admitting: Family Medicine

## 2011-02-06 ENCOUNTER — Encounter: Payer: Self-pay | Admitting: Family Medicine

## 2011-02-06 ENCOUNTER — Ambulatory Visit (INDEPENDENT_AMBULATORY_CARE_PROVIDER_SITE_OTHER): Payer: Medicare Other | Admitting: Family Medicine

## 2011-02-06 VITALS — BP 132/70 | HR 68 | Temp 97.8°F | Resp 24 | Wt 114.0 lb

## 2011-02-06 DIAGNOSIS — R197 Diarrhea, unspecified: Secondary | ICD-10-CM

## 2011-02-06 MED ORDER — DIPHENOXYLATE-ATROPINE 2.5-0.025 MG PO TABS: ORAL_TABLET | ORAL | Status: DC

## 2011-02-06 NOTE — Patient Instructions (Signed)
Keep drinking plenty of fluids and let me know if you have other concerns.  Take care.

## 2011-02-06 NOTE — Assessment & Plan Note (Signed)
Chronic issue.  Will request old records from ER and d/w pt about drinking plenty of fluids.  She agrees.  No change in meds today.  Doesn't appear dehydrated and BP okay.  >25 min spent with face to face with patient >50% counseling and coordinating care

## 2011-02-06 NOTE — Progress Notes (Signed)
Seen in ER.  I don't have records from [facility] yet to review, they are requested.  Here with daughter.  Was in ER with diarrhea, confusion, dehydration.    She had elevated BP and concurrent vision changes, per daughter.  H/o glaucoma and macular degeneration- "some days are better than others."  She has fu with eye clinic pending.    H/o incontinent to urine and stool for years.  She had diarrhea for a few days before the ER visit.  Per daughter, no dx of UTI at ER.  Given IVF and released from ER.  Currently with episodic sx, once every few days.  No fevers, no blood in stool.  No vomiting.  Normal BMs on days w/o diarrhea.  City water.  No other sick contacts known, no GI sx with others recently. No recent med changes.  She has CNA help at home in the AM.    nad ncat Mmm rrr ctab abd soft, not ttp, normal BS Ext with 1+ edema, at baseline

## 2011-02-07 ENCOUNTER — Telehealth: Payer: Self-pay | Admitting: Family Medicine

## 2011-02-07 NOTE — Telephone Encounter (Signed)
I am okay with this if Dr. Leonard Schwartz is okay.

## 2011-02-07 NOTE — Telephone Encounter (Signed)
Pt's daughter is asking if patient can switch from [facility] to Dr.Duncan.  Pt's daughter and her husband are seen by Dr.Duncan.  Please advise.

## 2011-02-08 NOTE — Telephone Encounter (Signed)
Okay with me 

## 2011-02-13 ENCOUNTER — Telehealth: Payer: Self-pay | Admitting: Family Medicine

## 2011-02-13 DIAGNOSIS — I10 Essential (primary) hypertension: Secondary | ICD-10-CM

## 2011-02-13 DIAGNOSIS — M8580 Other specified disorders of bone density and structure, unspecified site: Secondary | ICD-10-CM

## 2011-02-13 DIAGNOSIS — E538 Deficiency of other specified B group vitamins: Secondary | ICD-10-CM

## 2011-02-13 NOTE — Telephone Encounter (Signed)
Please contact pt. She is due for fasting labs and then get her a OV to go over them at the time of her choosing. Thanks.

## 2011-02-14 NOTE — Telephone Encounter (Signed)
Spoke with patient's daughter Dewayne Hatch. She is requesting that someone come to the residence and draw the labs. She said this has been done in the past and it would be much easier on the patient if she was able to have this done again. She thinks it was [facility] that drew them last time. She said her mother is 100% incontinent and her mornings are always extremely hard on her. I told her I would let you know and then call her back with a plan.

## 2011-02-14 NOTE — Telephone Encounter (Signed)
Please see if the ordered labs can be done at home.  Thanks.

## 2011-02-14 NOTE — Telephone Encounter (Signed)
Message left for patient to return my call.  

## 2011-02-19 ENCOUNTER — Telehealth: Payer: Self-pay | Admitting: Family Medicine

## 2011-02-19 DIAGNOSIS — D649 Anemia, unspecified: Secondary | ICD-10-CM

## 2011-02-19 DIAGNOSIS — R7989 Other specified abnormal findings of blood chemistry: Secondary | ICD-10-CM

## 2011-02-19 NOTE — Telephone Encounter (Signed)
Spoke with patient's daughter and she will bring pt in for lab work, daughter spoke with Shirlee Limerick

## 2011-02-19 NOTE — Telephone Encounter (Signed)
Please call pt.  Records from ER came through.  She was anemic with slight dec in kidney function.  I would recheck both.  Please arrange for lab visit.  Orders are in.  Thanks.

## 2011-02-19 NOTE — Telephone Encounter (Signed)
LMOVM to call back 

## 2011-02-20 NOTE — Telephone Encounter (Signed)
Noted  

## 2011-02-21 NOTE — Telephone Encounter (Signed)
[facility] referral faxed to [facility] for lab draw and HH. Will call Ann to schedule the 30 minute FU appt after labs are drawn .

## 2011-03-06 ENCOUNTER — Encounter: Payer: Self-pay | Admitting: Family Medicine

## 2011-03-07 ENCOUNTER — Ambulatory Visit (INDEPENDENT_AMBULATORY_CARE_PROVIDER_SITE_OTHER): Payer: Medicare Other | Admitting: Family Medicine

## 2011-03-07 ENCOUNTER — Encounter: Payer: Self-pay | Admitting: Family Medicine

## 2011-03-07 DIAGNOSIS — M899 Disorder of bone, unspecified: Secondary | ICD-10-CM

## 2011-03-07 DIAGNOSIS — D518 Other vitamin B12 deficiency anemias: Secondary | ICD-10-CM

## 2011-03-07 DIAGNOSIS — Z66 Do not resuscitate: Secondary | ICD-10-CM | POA: Insufficient documentation

## 2011-03-07 DIAGNOSIS — M949 Disorder of cartilage, unspecified: Secondary | ICD-10-CM

## 2011-03-07 NOTE — Patient Instructions (Addendum)
Stop the fosamax and the B12.  I will make arrangements about your labs December.  Take care.  Glad to see you today.

## 2011-03-07 NOTE — Assessment & Plan Note (Signed)
Recheck b12 and bmet in 6 months.  >25 min spent with face to face with patient, >50% counseling and/or coordinating care.  She has a significant amount of trouble coming to appointments.  I'll ask for home draw again.

## 2011-03-07 NOTE — Progress Notes (Signed)
Here for follow up of labs.  Cr reviewed.  With GFR, likely not reasonable to continue fosamax.  D/w pt and daughter about this.  She's been on it for 2+ years.  Both are interested in stopping the med.  B12 def- s/p replacement and now with elevated level.  D/w pt about stopping med for now.  She understood.    Other labs d/w pt and daughter.   Feeling well except for R shoulder pain and she'll f/u with ortho about that.    Nad rrr ctab Ext well perfused

## 2011-03-07 NOTE — Assessment & Plan Note (Signed)
Stop fosamax due to GFR.  Pt agrees.

## 2011-04-03 ENCOUNTER — Other Ambulatory Visit: Payer: Self-pay | Admitting: *Deleted

## 2011-04-03 MED ORDER — METOPROLOL TARTRATE 25 MG PO TABS: 25.0000 mg | ORAL_TABLET | Freq: Two times a day (BID) | ORAL | Status: DC

## 2011-05-01 ENCOUNTER — Other Ambulatory Visit: Payer: Self-pay | Admitting: Family Medicine

## 2011-05-01 ENCOUNTER — Other Ambulatory Visit: Payer: Self-pay | Admitting: *Deleted

## 2011-05-01 MED ORDER — GABAPENTIN 100 MG PO CAPS: 100.0000 mg | ORAL_CAPSULE | ORAL | Status: DC

## 2011-07-25 ENCOUNTER — Other Ambulatory Visit: Payer: Self-pay | Admitting: Family Medicine

## 2011-07-26 ENCOUNTER — Ambulatory Visit (INDEPENDENT_AMBULATORY_CARE_PROVIDER_SITE_OTHER): Payer: Medicare Other | Admitting: Family Medicine

## 2011-07-26 ENCOUNTER — Encounter: Payer: Self-pay | Admitting: Family Medicine

## 2011-07-26 DIAGNOSIS — R609 Edema, unspecified: Secondary | ICD-10-CM

## 2011-07-26 DIAGNOSIS — E538 Deficiency of other specified B group vitamins: Secondary | ICD-10-CM

## 2011-07-26 DIAGNOSIS — D518 Other vitamin B12 deficiency anemias: Secondary | ICD-10-CM

## 2011-07-26 DIAGNOSIS — I10 Essential (primary) hypertension: Secondary | ICD-10-CM

## 2011-07-26 DIAGNOSIS — N259 Disorder resulting from impaired renal tubular function, unspecified: Secondary | ICD-10-CM

## 2011-07-26 LAB — BASIC METABOLIC PANEL
BUN: 41 mg/dL — ABNORMAL HIGH (ref 6–23)
Calcium: 8.8 mg/dL (ref 8.4–10.5)
Creatinine, Ser: 2.2 mg/dL — ABNORMAL HIGH (ref 0.4–1.2)
GFR: 22.56 mL/min — ABNORMAL LOW (ref >60.00)
Glucose, Bld: 87 mg/dL (ref 70–99)
Sodium: 145 mEq/L (ref 135–145)

## 2011-07-26 MED ORDER — GABAPENTIN 100 MG PO CAPS: 100.0000 mg | ORAL_CAPSULE | Freq: Every day | ORAL | Status: AC

## 2011-07-26 NOTE — Progress Notes (Signed)
Edema in legs R>L, going on for weeks.  Increased from prev.  Less edema in AM, more as the day goes on. No CP, not sob.  Sleeping on 1 pillow.  She has skin changes on the BLE.  BP had prev been normal on mult outside checks per patient.  She has stockings, prev advised to use them, but hadn't used them any.   H/o B12 def, had been taken of B12 supplements due to high level, due for recheck.   PMH and SH reviewed  ROS: See HPI, otherwise noncontributory.  Meds, vitals, and allergies reviewed.   nad ncat Mmm Rrr, murmur noted Ctab, no inc wob abd soft, not ttp Ext with 1-2 + edema, R>L, pitting with diffuse chronic changes- the skin is pink from stasis changes but no skin breakdown or ulceration.  Distal 1+ pulses noted.

## 2011-07-26 NOTE — Patient Instructions (Signed)
Get your blood pressure checked several time and let me know about it next week. I would put the stockings on in the morning and then take them off at night. Let me know if the leg swelling gets worse.  Take care.   You can get your results through our phone system.  Follow the instructions on the blue card.

## 2011-07-28 ENCOUNTER — Encounter: Payer: Self-pay | Admitting: Family Medicine

## 2011-07-28 NOTE — Assessment & Plan Note (Addendum)
With BLE edema. BP prev controlled. See notes on labs.  Will have pt recheck BMET at OV next week and will have pt use stockings in meantime.  Further w/u will depend on her repeat BP and renal function.  Her lungs are clear and this doesn't appear to be an acute change.  >25 min spent with face to face with patient, >50% counseling and/or coordinating care.

## 2011-07-28 NOTE — Assessment & Plan Note (Signed)
Restart replacement.

## 2011-07-30 ENCOUNTER — Ambulatory Visit (INDEPENDENT_AMBULATORY_CARE_PROVIDER_SITE_OTHER): Payer: Medicare Other | Admitting: Family Medicine

## 2011-07-30 ENCOUNTER — Encounter: Payer: Self-pay | Admitting: Family Medicine

## 2011-07-30 VITALS — BP 144/70 | HR 59 | Temp 98.1°F | Wt 118.0 lb

## 2011-07-30 DIAGNOSIS — R011 Cardiac murmur, unspecified: Secondary | ICD-10-CM | POA: Insufficient documentation

## 2011-07-30 DIAGNOSIS — R799 Abnormal finding of blood chemistry, unspecified: Secondary | ICD-10-CM

## 2011-07-30 DIAGNOSIS — R7989 Other specified abnormal findings of blood chemistry: Secondary | ICD-10-CM

## 2011-07-30 DIAGNOSIS — D518 Other vitamin B12 deficiency anemias: Secondary | ICD-10-CM

## 2011-07-30 DIAGNOSIS — N259 Disorder resulting from impaired renal tubular function, unspecified: Secondary | ICD-10-CM

## 2011-07-30 DIAGNOSIS — I10 Essential (primary) hypertension: Secondary | ICD-10-CM

## 2011-07-30 LAB — BASIC METABOLIC PANEL
BUN: 37 mg/dL — ABNORMAL HIGH (ref 6–23)
CO2: 22 mEq/L (ref 19–32)
Calcium: 8.8 mg/dL (ref 8.4–10.5)
Creatinine, Ser: 2 mg/dL — ABNORMAL HIGH (ref 0.4–1.2)

## 2011-07-30 MED ORDER — LISINOPRIL 40 MG PO TABS: 20.0000 mg | ORAL_TABLET | Freq: Every day | ORAL | Status: DC

## 2011-07-30 NOTE — Patient Instructions (Signed)
You can get your results through our phone system.  Follow the instructions on the blue card. See Shirlee Limerick about your referral before you leave today. Drink 2 more glasses (8oz) of water a day.  Don't change your blood pressure meds for now.

## 2011-07-30 NOTE — Progress Notes (Signed)
Addended by: Lars Mage on: 07/30/2011 05:26 PM   Modules accepted: Orders

## 2011-07-30 NOTE — Assessment & Plan Note (Addendum)
Improved today, continue meds for now and recheck bmet.  Continue compression stockings, on both legs.   --Addendum-- I called pt's daughter.  I would cut lisinopril to 20mg  a day and then come back in about 2 weeks (ie after the echo is done).  She agrees.

## 2011-07-30 NOTE — Progress Notes (Signed)
B12 was lower and restarted recently.  Elevated Cr.  She doesn't drink much water, is on ACE.  No recent Nsaid use.  Cr was up to 2.2.  Had been up to 2 prev and then returned to ~1.2-1.4  Known murmur but no known CAD, CHF, MI.  BNP was up with BLE edema.    HTN.  Improved today.  No change in meds.  No CP, not sob.  BLE noted.    Meds, vitals, and allergies reviewed.   ROS: See HPI.  Otherwise, noncontributory.  nad ncat Mmm with SEM noted rrr ctab abd soft, not ttp Ext with 1-2+ edema, R leg in compression stocking, but not L leg

## 2011-07-30 NOTE — Assessment & Plan Note (Signed)
BP is improved and lungs are clear.  Will check echo.  I don't know if the BNP is baseline or a sig elevation.  She isn't sob and has no orthopnea. Lungs ctab.  I d/w pt and daughter in detail that I would have more info after repeat BMET and echo.  Okay for outpatient f/u based on exam today.

## 2011-07-30 NOTE — Assessment & Plan Note (Signed)
Restart oral B12.   °

## 2011-07-30 NOTE — Assessment & Plan Note (Signed)
Continue ACE for now since BP is improved and drink more water.  Recheck BMET and check echo.

## 2011-08-13 ENCOUNTER — Other Ambulatory Visit (INDEPENDENT_AMBULATORY_CARE_PROVIDER_SITE_OTHER): Payer: Medicare Other | Admitting: *Deleted

## 2011-08-13 DIAGNOSIS — R011 Cardiac murmur, unspecified: Secondary | ICD-10-CM

## 2011-08-13 DIAGNOSIS — R609 Edema, unspecified: Secondary | ICD-10-CM

## 2011-08-13 DIAGNOSIS — I716 Thoracoabdominal aortic aneurysm, without rupture: Secondary | ICD-10-CM

## 2011-08-15 ENCOUNTER — Ambulatory Visit (INDEPENDENT_AMBULATORY_CARE_PROVIDER_SITE_OTHER): Payer: Medicare Other | Admitting: Family Medicine

## 2011-08-15 ENCOUNTER — Encounter: Payer: Self-pay | Admitting: Cardiovascular Disease

## 2011-08-15 ENCOUNTER — Other Ambulatory Visit: Payer: Self-pay | Admitting: Family Medicine

## 2011-08-15 ENCOUNTER — Encounter: Payer: Self-pay | Admitting: Family Medicine

## 2011-08-15 DIAGNOSIS — I77819 Aortic ectasia, unspecified site: Secondary | ICD-10-CM

## 2011-08-15 DIAGNOSIS — N259 Disorder resulting from impaired renal tubular function, unspecified: Secondary | ICD-10-CM

## 2011-08-15 DIAGNOSIS — I77810 Thoracic aortic ectasia: Secondary | ICD-10-CM

## 2011-08-15 DIAGNOSIS — I10 Essential (primary) hypertension: Secondary | ICD-10-CM

## 2011-08-15 LAB — BASIC METABOLIC PANEL
BUN: 38 mg/dL — ABNORMAL HIGH (ref 6–23)
Calcium: 8.9 mg/dL (ref 8.4–10.5)
GFR: 20.7 mL/min — ABNORMAL LOW (ref >60.00)
Potassium: 5 mEq/L (ref 3.5–5.1)

## 2011-08-15 NOTE — Patient Instructions (Signed)
See Shirlee Limerick about your referral before you leave today. You can get your results through our phone system.  Follow the instructions on the blue card. Don't change your meds for now.   Take care.

## 2011-08-15 NOTE — Progress Notes (Signed)
CKD with ARF- ACE dose prev cut in half.  No CP, not SOB.  BLE edema continues, stocking on R leg only, this is the leg with more edema usually.  BP had not been elevated significantly per patient outside of clinic.    Aortic root dilation.  Echo reviewed with patient.  Prev labs reviewed with patient.  No CP, not sob.   PMH and SH reviewed  ROS: See HPI, otherwise noncontributory.  Meds, vitals, and allergies reviewed.   nad ncat rrr with murmur noted Ctab, no inc in wob abd soft, not ttp Legs, R>L with edema, no skin breakdown, R leg in compression stocking

## 2011-08-16 ENCOUNTER — Encounter: Payer: Self-pay | Admitting: Family Medicine

## 2011-08-16 DIAGNOSIS — I10 Essential (primary) hypertension: Secondary | ICD-10-CM | POA: Insufficient documentation

## 2011-08-16 DIAGNOSIS — I77810 Thoracic aortic ectasia: Secondary | ICD-10-CM | POA: Insufficient documentation

## 2011-08-16 NOTE — Assessment & Plan Note (Signed)
Hold ACE and follow, see notes on labs.

## 2011-08-16 NOTE — Assessment & Plan Note (Signed)
With elevated Cr, stop ACE, recheck BMET in ~10 days and refer to cards for input on aorta and BP.

## 2011-08-16 NOTE — Assessment & Plan Note (Signed)
She and daughter don't want surgery and I don't think surgery is a reasonable option. I would like cards input.  Will refer.  I am trying to balance BP and Cr, will hold ACE in meantime.  >25 min spent with face to face with patient, >50% counseling and/or coordinating care.

## 2011-08-28 ENCOUNTER — Other Ambulatory Visit (INDEPENDENT_AMBULATORY_CARE_PROVIDER_SITE_OTHER): Payer: Medicare Other

## 2011-08-28 DIAGNOSIS — I10 Essential (primary) hypertension: Secondary | ICD-10-CM

## 2011-08-28 LAB — BASIC METABOLIC PANEL
CO2: 21 mEq/L (ref 19–32)
Calcium: 8.6 mg/dL (ref 8.4–10.5)
Creatinine, Ser: 2.5 mg/dL — ABNORMAL HIGH (ref 0.4–1.2)
GFR: 19.37 mL/min — ABNORMAL LOW (ref >60.00)
Sodium: 144 mEq/L (ref 135–145)

## 2011-08-29 NOTE — Progress Notes (Signed)
Quick Note:  Pt's daughter aware ______ 

## 2011-08-30 ENCOUNTER — Ambulatory Visit (INDEPENDENT_AMBULATORY_CARE_PROVIDER_SITE_OTHER): Payer: Medicare Other | Admitting: Cardiovascular Disease

## 2011-08-30 ENCOUNTER — Encounter: Payer: Self-pay | Admitting: Cardiovascular Disease

## 2011-08-30 DIAGNOSIS — I351 Nonrheumatic aortic (valve) insufficiency: Secondary | ICD-10-CM | POA: Insufficient documentation

## 2011-08-30 DIAGNOSIS — I359 Nonrheumatic aortic valve disorder, unspecified: Secondary | ICD-10-CM

## 2011-08-30 DIAGNOSIS — I712 Thoracic aortic aneurysm, without rupture: Secondary | ICD-10-CM

## 2011-08-30 DIAGNOSIS — I7121 Aneurysm of the ascending aorta, without rupture: Secondary | ICD-10-CM | POA: Insufficient documentation

## 2011-08-30 DIAGNOSIS — R011 Cardiac murmur, unspecified: Secondary | ICD-10-CM

## 2011-08-30 DIAGNOSIS — R609 Edema, unspecified: Secondary | ICD-10-CM

## 2011-08-30 DIAGNOSIS — I10 Essential (primary) hypertension: Secondary | ICD-10-CM

## 2011-08-30 MED ORDER — HYDRALAZINE HCL 50 MG PO TABS: 50.0000 mg | ORAL_TABLET | Freq: Three times a day (TID) | ORAL | Status: DC

## 2011-08-30 NOTE — Progress Notes (Signed)
Patient ID: Ashley Conner, female    DOB: Jan 24, 1925, 77 y.o.   MRN: [id number]  HPI Comments: Very pleasant 77 year old woman with history of renal insufficiency, incontinence, hypertension, murmur "her whole life" with recent echocardiogram showing moderate aortic valve regurgitation and severely dilated aortic root up to the aortic arch estimated at 5.7 cm, who presents by referral for evaluation.  She reports that her biggest complaint is her leg swelling. They are red, sometimes sore. Her walking is difficult and she uses a walker. She does not like to drink fluids as she has incontinence. She keeps a bottle of water in the refrigerator and when she passes by, she has a sip. If she drinks too much, she has accidents.  She denies any significant shortness of breath, lightheadedness or dizziness.  Recent creatinine is greater than 2 EKG shows normal sinus rhythm with a rate of 75 beats per minute with nonspecific ST abnormality in V5, V6, one and aVL   Outpatient Encounter Prescriptions as of 08/30/2011  Medication Sig Dispense Refill  . beta carotene 78295 UNIT capsule Take 25,000 Units by mouth every other day.        . diphenoxylate-atropine (LOMOTIL) 2.5-0.025 MG per tablet Take one tablet as needed daily for diarrhea.  Limit use as much as possible  30 tablet  2  . Elastic Bandages & Supports (FUTURO FIRM COMPRESSION HOSE) MISC Wear as directed       . gabapentin (NEURONTIN) 100 MG capsule Take 1 capsule (100 mg total) by mouth daily.  90 capsule  3  . Iodine-Vitamin A 0.262 534 5958 MG-UNIT CAPS Take by mouth daily.        Marland Kitchen latanoprost (XALATAN) 0.005 % ophthalmic solution Place 1 drop into both eyes at bedtime.        . metoprolol tartrate (LOPRESSOR) 25 MG tablet Take 1 tablet (25 mg total) by mouth 2 (two) times daily.  60 tablet  6  . amLODipine (NORVASC) 5 MG tablet TAKE 1 TABLET DAILY  90 tablet  10     Review of Systems  Constitutional: Negative.   HENT: Negative.   Eyes:  Negative.   Respiratory: Negative.   Cardiovascular: Positive for leg swelling.  Gastrointestinal: Negative.   Musculoskeletal: Positive for gait problem.  Skin: Negative.   Neurological: Negative.   Hematological: Negative.   Psychiatric/Behavioral: Negative.   All other systems reviewed and are negative.    BP 148/76  Pulse 75  Ht 5' (1.524 m)  Wt 118 lb 1.9 oz (53.579 kg)  BMI 23.07 kg/m2  Physical Exam  Nursing note and vitals reviewed. Constitutional: She is oriented to person, place, and time. She appears well-developed and well-nourished.  HENT:  Head: Normocephalic.  Nose: Nose normal.  Mouth/Throat: Oropharynx is clear and moist.  Eyes: Conjunctivae are normal. Pupils are equal, round, and reactive to light.  Neck: Normal range of motion. Neck supple. No JVD present.  Cardiovascular: Normal rate, regular rhythm, S1 normal, S2 normal and intact distal pulses.  Exam reveals no gallop and no friction rub.   Murmur heard.  Crescendo systolic murmur is present with a grade of 2/6       1+ pitting edema to below the knees  Pulmonary/Chest: Effort normal and breath sounds normal. No respiratory distress. She has no wheezes. She has no rales. She exhibits no tenderness.  Abdominal: Soft. Bowel sounds are normal. She exhibits no distension. There is no tenderness.  Musculoskeletal: Normal range of motion. She exhibits no edema and  no tenderness.  Lymphadenopathy:    She has no cervical adenopathy.  Neurological: She is alert and oriented to person, place, and time. Coordination normal.  Skin: Skin is warm and dry. No rash noted. No erythema.  Psychiatric: She has a normal mood and affect. Her behavior is normal.         Assessment and Plan

## 2011-08-30 NOTE — Patient Instructions (Addendum)
You are doing well. Please stop the amlodipine Start hydralazine 50 mg three times a day Monitor the blood pressure everyday for a week then every other day. Call in with  Numbers    Please call us if you have new issues that need to be addressed before your next appt.  Your physician wants you to follow-up in: 1 months.  You will receive a reminder letter in the mail two months in advance. If you don't receive a letter, please call our office to schedule the follow-up appointment.

## 2011-08-30 NOTE — Assessment & Plan Note (Signed)
We have suggested she hold the amlodipine and start hydralazine 50 mg t.i.d.. She has a CNA and a pillbox that can help check her blood pressure and medication compliance. I've asked him to call our office or Dr. Para March for hypertension. If her blood pressure continues to be elevated, the hydralazine can be titrated upwards.

## 2011-08-30 NOTE — Assessment & Plan Note (Signed)
Severely dilated aortic root through to the aortic arch. We have discussed this with the patient and her daughter and I would agree that medical management would be best given she is a high surgical risk. We will work on aggressive blood pressure control. I do not think that repeat imaging is needed unless aggressive intervention is wanted.

## 2011-08-30 NOTE — Assessment & Plan Note (Signed)
I suspect her edema may be multifactorial though possibly exacerbated by amlodipine. I have suggested she hold her amlodipine for now and we will start hydralazine for blood pressure control.

## 2011-08-30 NOTE — Assessment & Plan Note (Signed)
Her murmur is probably moderate aortic valve regurgitation. She does not have any significant symptoms of shortness of breath. I suspect she is hypovolemic as she does not drink very much and this may help prevent some of the complications from her aortic valve regurgitation.

## 2011-09-02 ENCOUNTER — Telehealth: Payer: Self-pay | Admitting: *Deleted

## 2011-09-02 MED ORDER — AMLODIPINE BESYLATE 5 MG PO TABS: 5.0000 mg | ORAL_TABLET | Freq: Every day | ORAL | Status: DC

## 2011-09-02 NOTE — Telephone Encounter (Signed)
Pt sent to cardiologist  (Dr Debbe Odea) Ashley Conner, he took her off Norvasc he thought it was causing swelling and edema. He gave her Hydralazine 150 and she had rxn of swelling and difficulty breathing. Pt is ok now has not taken anymore med.  Daughter says she was not please with the cardiologist and upset that he did not catch that her metoprolol reacts with hydralazine. Daughter wants to know if she should restart the norvasc.

## 2011-09-02 NOTE — Telephone Encounter (Signed)
Patient informed. 

## 2011-09-02 NOTE — Telephone Encounter (Signed)
Please call pt/family back. Hydralazine was a reasonable option with the metoprolol, as many patients can tolerate the two together and do well with the combination. However, if she didn't tolerate it, then she should not take it. I added the hydralazine to the intolerance list.  I would still like cards input and I'll forward this over to them.  In the meantime, it's okay to continue back on the metorolol and norvasc.  She'll likely need to come back off the norvasc in the future, but I want cards input on that.  Thanks.    Please route to Dr. Mariah Milling after family is contacted.

## 2011-09-08 NOTE — Telephone Encounter (Signed)
If hydralazine did not work for her, options include going back on amlodipine though this may be contributing to edema.  Other options for BP control if we try to hold amlodipine would include clonidine 0.1 mg BID titrating as needed. Or possibly isosorbide dinitrate 20 TID  Trying to stay away from Ca channel blockers (edema), HCTZ (incontinence), ACE/ARB in setting of renal dysfunction. We might even get a better blood pressure drop on bystolic (rather than metop succinate) if needed.   Do you want [place] to call her?

## 2011-09-08 NOTE — Telephone Encounter (Signed)
I would be in favor of the nitrate with or without the amlodipine, but I'd defer to Dr. Mariah Milling.  She has no f/u scheduled, so I would like her to have f/u with either me or Dr. Mariah Milling after the med change.

## 2011-09-11 NOTE — Telephone Encounter (Signed)
Dr. Mariah Milling, what exactly do you want me to tell pt? Send Rx for Isosorbide Dinitrite 20 TID? And do you want me to tell pt to restart Amlodpine or not? Please clarify. I will schedule pt f/u after med change.

## 2011-09-16 NOTE — Telephone Encounter (Signed)
Would hold amlodipine and start  isosorbide dinitrate 10 mg TID.  #30, refill x 3 We can always titrate upwards later  Would monitor BP Trying to get off amlodipine to make leg swelling better

## 2011-09-17 MED ORDER — ISOSORBIDE DINITRATE 10 MG PO TABS: 10.0000 mg | ORAL_TABLET | Freq: Three times a day (TID) | ORAL | Status: DC

## 2011-09-17 NOTE — Telephone Encounter (Signed)
Spoke to pt's daughter, Dewayne Hatch, notified of msg below. Pt will start Isosorbide Dinitrate 10mg  TID, she has already stopped Amlodipine and she will continue to hold. Dewayne Hatch is going to fax in her BP results from past 2 weeks. Will send in Rx for Isosorbide now. Per Dr. Mariah Milling, below correction for #90 tablets instead of #30.

## 2011-09-18 ENCOUNTER — Telehealth: Payer: Self-pay

## 2011-09-18 NOTE — Telephone Encounter (Signed)
Ashley Conner was to bring her blood pressure readings by.  Her blood pressure reading are running from 140/87 to 189/86. She did check it again on 12/31 BP read 138/84, 09/11/11 169/83,

## 2011-09-19 NOTE — Telephone Encounter (Signed)
Ashley Conner, We are going to try to wean her off amlodipine to improve leg swelling. Can we see what she is taking? I would like to start slow with isosorbide dinitrate 10 mg TID, titrating upwards

## 2011-09-19 NOTE — Telephone Encounter (Signed)
Please advise what she is to do regarding blood pressure readings?

## 2011-09-20 NOTE — Telephone Encounter (Signed)
I have already spoke to daughter, per phone note dated 12/24, spoke to her on 1/8:  Lanny Hurst, RN 09/17/2011 10:37 AM Signed  Spoke to pt's daughter, Dewayne Hatch, notified of msg below. Pt will start Isosorbide Dinitrate 10mg  TID, she has already stopped Amlodipine and she will continue to hold. Dewayne Hatch is going to fax in her BP results from past 2 weeks.  These readings below are after she has only been on new med for 2 days and she has been off of Amlodipine for a few weeks now. Daughter did say her swelling has resolved.

## 2011-09-23 ENCOUNTER — Other Ambulatory Visit: Payer: Self-pay | Admitting: Family Medicine

## 2011-09-23 ENCOUNTER — Telehealth: Payer: Self-pay | Admitting: Internal Medicine

## 2011-09-23 MED ORDER — AMLODIPINE BESYLATE 10 MG PO TABS: 10.0000 mg | ORAL_TABLET | Freq: Every day | ORAL | Status: DC

## 2011-09-23 NOTE — Telephone Encounter (Signed)
Patient's daughter called and stated her mother Ashley Conner isn't doing well.  She had to call the EMT because her BP was 225/95.  EKG was fine.  But she did stop the Isosorbide Dinitrate that Dr. Mariah Milling put her on.  Ashley Conner started her back taking Lisinopril and Norvasc yesterday around noon.  Her BP this morning 180/90 but they can't keep her awake long enough to do anything with her.  She is sleeping all the time and not eating.  Wanted to know your suggestions.    Mobile: [phone]

## 2011-09-23 NOTE — Progress Notes (Signed)
I called pt's daughter.  Pt is still on metoprolol.  She didn't tolerate hydralazine and BP was sig elevated on long acting nitrate.  We talked about options.  At this point, the norvasc can make the edema worse, the lisinopril can affect renal function, she cannot tolerate hydralazine.  Daughter had restarted lisinopril and norvasc, stopped the nitrate.  BP was some better after that.  Per daughter, "there aren't any really good options" and I agreed with her.  I would try to limit the ACE as renal effect could lead to AMS, fluid retention.  Will try with norvasc over next few days and she'll call back with update.  Continue BB for now.  I think this is reasonable and it appears that this is becoming more of a palliative situation.

## 2011-09-24 NOTE — Telephone Encounter (Signed)
Ashley Givens, MD 09/23/2011 6:36 PM Signed  I called pt's daughter. Pt is still on metoprolol. She didn't tolerate hydralazine and BP was sig elevated on long acting nitrate. We talked about options. At this point, the norvasc can make the edema worse, the lisinopril can affect renal function, she cannot tolerate hydralazine. Daughter had restarted lisinopril and norvasc, stopped the nitrate. BP was some better after that. Per daughter, "there aren't any really good options" and I agreed with her. I would try to limit the ACE as renal effect could lead to AMS, fluid retention. Will try with norvasc over next few days and she'll call back with update. Continue BB for now. I think this is reasonable and it appears that this is becoming more of a palliative situation.

## 2011-09-26 ENCOUNTER — Other Ambulatory Visit: Payer: Self-pay | Admitting: *Deleted

## 2011-09-26 MED ORDER — METOPROLOL TARTRATE 25 MG PO TABS: 25.0000 mg | ORAL_TABLET | Freq: Two times a day (BID) | ORAL | Status: DC

## 2011-09-27 ENCOUNTER — Telehealth: Payer: Self-pay | Admitting: Family Medicine

## 2011-09-27 NOTE — Telephone Encounter (Signed)
Daughter advised.

## 2011-09-27 NOTE — Telephone Encounter (Signed)
Patient's daughter called to let you know patient is feeling better.

## 2011-09-27 NOTE — Telephone Encounter (Signed)
Noted, thanks.  Have her let me know about her pressure toward the end of next week.

## 2011-11-11 ENCOUNTER — Telehealth: Payer: Self-pay | Admitting: *Deleted

## 2011-11-11 DIAGNOSIS — I359 Nonrheumatic aortic valve disorder, unspecified: Secondary | ICD-10-CM

## 2011-11-11 NOTE — Telephone Encounter (Signed)
Denise from Hospice says that Lenise Herald (patient's daughter) has contacted her for [employer]  A form is in your in box and they also request a demographics sheet and her last OV note from you.

## 2011-11-11 NOTE — Telephone Encounter (Signed)
Faxed and scanned

## 2011-11-11 NOTE — Telephone Encounter (Signed)
Signed, please send in.  Thanks.  

## 2011-11-14 ENCOUNTER — Encounter: Payer: Self-pay | Admitting: Family Medicine

## 2011-11-14 DIAGNOSIS — Z515 Encounter for palliative care: Secondary | ICD-10-CM | POA: Insufficient documentation

## 2011-11-15 ENCOUNTER — Telehealth: Payer: Self-pay | Admitting: Family Medicine

## 2011-11-15 NOTE — Telephone Encounter (Signed)
Triage Record Num: [id number] Operator: Baldomero Lamy Patient Name: Ashley Conner Call Date & Time: 11/15/2011 9:05:17AM Patient Phone: [phone] PCP: Crawford Givens Patient Gender: Female PCP Fax : Patient DOB: 1925-04-15 Practice Name: [facility] Reason for Call: Caller: Denise/Mother; PCP: Crawford Givens Clelia Croft); CB#: [phone]; ; ; Call regarding Needs MD Notes for Hospice Pt.; Angelique Blonder from [facility] calling requesting MD notes be faxed. Trx to Medical Records. Protocol(s) Used: Office Note Recommended Outcome per Protocol: Information Noted and Sent to Office Reason for Outcome: Caller information to office Care Advice: ~ 11/15/2011 9:11:53AM Page 1 of 1 [id number]

## 2011-11-15 NOTE — Telephone Encounter (Signed)
Notes faxed.

## 2011-11-15 NOTE — Telephone Encounter (Signed)
I can't tell if the needed forms were sent.  Please check.

## 2011-11-20 DIAGNOSIS — I77819 Aortic ectasia, unspecified site: Secondary | ICD-10-CM

## 2011-11-20 DIAGNOSIS — F068 Other specified mental disorders due to known physiological condition: Secondary | ICD-10-CM

## 2011-11-20 DIAGNOSIS — N19 Unspecified kidney failure: Secondary | ICD-10-CM

## 2011-11-29 ENCOUNTER — Other Ambulatory Visit: Payer: Self-pay | Admitting: Family Medicine

## 2011-11-29 NOTE — Progress Notes (Signed)
Pt needed FL2 signed, this was done.

## 2011-12-09 ENCOUNTER — Encounter: Payer: Self-pay | Admitting: Family Medicine

## 2011-12-09 ENCOUNTER — Telehealth: Payer: Self-pay | Admitting: *Deleted

## 2011-12-09 NOTE — Telephone Encounter (Signed)
Form is on your desk.  Patient just was admitted to [facility] and came in with 3 meds that are not on her FL2 form.  They are in hopes of getting this back by 5 pm in order for the patient to receive these meds at 8 PM tonight.  They are listed on form.  It looks like the Amlodipine was discontinued.  Please complete and give to me to fax.

## 2011-12-09 NOTE — Telephone Encounter (Signed)
Please send back and update the med list.  Thanks.

## 2011-12-09 NOTE — Telephone Encounter (Signed)
Faxed and med list updated

## 2011-12-13 ENCOUNTER — Telehealth: Payer: Self-pay | Admitting: *Deleted

## 2011-12-13 NOTE — Telephone Encounter (Signed)
Phoned to pharmacy, left on VM.

## 2011-12-13 NOTE — Telephone Encounter (Signed)
Please call in #30 with 1rf.  Thanks.

## 2011-12-13 NOTE — Telephone Encounter (Signed)
Victorino Dike at [facility] says they received a Rx for Ativan 5 mg every 4 hours as needed for SOB.  There was no quantity listed and they cannot send it out without a quantity but the facility is asking for it ASAP.  Please advise and I will phone in or it can be faxed to the number listed above.

## 2011-12-18 ENCOUNTER — Emergency Department: Payer: Self-pay | Admitting: Emergency Medicine

## 2011-12-18 LAB — URINALYSIS, COMPLETE
Bilirubin,UR: NEGATIVE
Glucose,UR: NEGATIVE mg/dL (ref 0–75)
Protein: >=500
Specific Gravity: 1.014 (ref 1.003–1.030)
Squamous Epithelial: 1

## 2011-12-25 ENCOUNTER — Telehealth: Payer: Self-pay

## 2011-12-25 NOTE — Telephone Encounter (Signed)
Please call the pharmacy, ask for early refill for this month.  Would continue with every other day dosing from this point on.  Thanks.

## 2011-12-25 NOTE — Telephone Encounter (Signed)
Jacquelene at pharmacy notified as instructed by telephone. Ronn Melena at [facility] informed as instructed by telephone.

## 2011-12-25 NOTE — Telephone Encounter (Signed)
Ms. Ashley Conner with [facility] said pt has been taking Beta Carotene 45409 units by mouth daily instead of as prescribed every other day. Ms. Ashley Conner said this was due to misread of pts chart. Pt started taking med daily on 12/10/11 and total of 15 capsules were prescribed at that time. [facility] until 01/02/12. Pt is not having any problems or symptoms. [facility] pharmacy contact # [phone] if needed and Ms. Ashley Conner can be reached at [phone]. Ms Ashley Conner understands Dr Para March will be in office this afternoon.

## 2012-01-01 ENCOUNTER — Telehealth: Payer: Self-pay | Admitting: Family Medicine

## 2012-01-01 NOTE — Telephone Encounter (Signed)
Pt's daughter dropped off handi-cap sticker form to be filled out. I put the form in your In-Box on your desk.

## 2012-01-01 NOTE — Telephone Encounter (Signed)
I'll address when I get back to the office.

## 2012-01-07 ENCOUNTER — Other Ambulatory Visit: Payer: Self-pay | Admitting: *Deleted

## 2012-01-07 MED ORDER — VITAMIN B-12 1000 MCG PO TABS: 1000.0000 ug | ORAL_TABLET | Freq: Every day | ORAL | Status: AC

## 2012-01-10 ENCOUNTER — Telehealth: Payer: Self-pay

## 2012-01-10 NOTE — Telephone Encounter (Signed)
Faxed

## 2012-01-10 NOTE — Telephone Encounter (Signed)
Please fax written order for second step tb skin test to [phone].  Thanks.

## 2012-01-10 NOTE — Telephone Encounter (Signed)
Please clarify the results on the prev test and the need for the f/u.

## 2012-01-10 NOTE — Telephone Encounter (Signed)
This is normal procedure.  Ashley Conner is a new move-in patient and they require 2 tb skin tests, 1 month apart.  The first one was negative per Toni Amend at [facility].

## 2012-01-10 NOTE — Telephone Encounter (Signed)
Ashley Conner with [facility] request written order for second step tb skin test faxed to [phone]. Pt had step one tb skin test done on 12/08/11.Please advise.

## 2012-01-20 ENCOUNTER — Telehealth: Payer: Self-pay

## 2012-01-20 MED ORDER — ACETAMINOPHEN 325 MG PO TABS: ORAL_TABLET | ORAL | Status: AC

## 2012-01-20 NOTE — Telephone Encounter (Signed)
Ashley Conner with [employer] left v/m pt fell at 1:15 am this morning trying to get out of recliner without putting leg rest down. Pt hit left eye; lt eye swollen red and purple. Hospice notified at 1:30 am and said not to send to hospital unless pt requested to go. Pt did not want to go to hospital; ice applied to lt eye and pt having no complaints. Ashley Conner left no contact # just wanted Dr Para March to be aware of fall.

## 2012-01-20 NOTE — Telephone Encounter (Signed)
Sent to me so I will forward

## 2012-01-20 NOTE — Telephone Encounter (Signed)
Noted. Not on blood thinners.  Will route to Dr. Algis Downs

## 2012-01-20 NOTE — Telephone Encounter (Signed)
Ok to send tylenol.  plz notify whoever needs to be notified.

## 2012-01-20 NOTE — Telephone Encounter (Signed)
This medication was not on the patient's med list, added for refill request.  Please advise.

## 2012-01-22 NOTE — Telephone Encounter (Signed)
Noted  

## 2012-01-24 ENCOUNTER — Other Ambulatory Visit: Payer: Self-pay | Admitting: *Deleted

## 2012-01-24 MED ORDER — "CURITY ABDOMINAL 8""X10"" PADS": MEDICATED_PAD | Status: AC

## 2012-01-27 ENCOUNTER — Other Ambulatory Visit: Payer: Self-pay | Admitting: *Deleted

## 2012-01-27 ENCOUNTER — Other Ambulatory Visit: Payer: Self-pay | Admitting: Family Medicine

## 2012-01-27 MED ORDER — BETA CAROTENE 25000 UNITS PO CAPS: 25000.0000 [IU] | ORAL_CAPSULE | ORAL | Status: DC

## 2012-01-27 MED ORDER — METOPROLOL TARTRATE 25 MG PO TABS: 25.0000 mg | ORAL_TABLET | Freq: Two times a day (BID) | ORAL | Status: AC

## 2012-01-30 ENCOUNTER — Telehealth: Payer: Self-pay | Admitting: Family Medicine

## 2012-01-30 ENCOUNTER — Telehealth: Payer: Self-pay

## 2012-01-30 NOTE — Telephone Encounter (Signed)
Please notify pt's family.  I talked with admitting MD and coordinating service at [facility].  They'll be in touch with family.  I'll await update.  I tried to call family with update and couldn't get an answer.  Thanks.

## 2012-01-30 NOTE — Telephone Encounter (Signed)
[place] [phone] called pt needs to stay 3 days and 3 nights in hospital prior to going to [facility]. Ann said [facility] will have ambulance take pt to [facility] ER later today for evaluation for admission. Cassandra with [facility] left v/m [facility] cannot provide respite care for pt since she is already in assisted living facility. Order for hospital admission can be faxed to [facility] [phone].

## 2012-01-30 NOTE — Telephone Encounter (Signed)
Spoke with Dawn in Admissions at [facility].  She says that without hospital privileges, you would need to page the Hospitalist at [phone] and see if they would agree to accept the patient.  If so, we should call Dawn back in Admissions at [phone] and give her the information.

## 2012-01-30 NOTE — Telephone Encounter (Signed)
I called her daughter Dewayne Hatch.  We agreed to cancel the appointment.  Patient continues to decline and would benefit from SNF.  She'll be in contact with SW through hospice and I'll fill out the [facility] if/when needed.  I asked Dewayne Hatch to check on respite care via hospice.  She will do so.   Please cancel the 02/04/12 OV.  Thanks.

## 2012-01-30 NOTE — Telephone Encounter (Signed)
Patient's daughter,Ann, needs to talk to you about admitting her mother to skilled nursing.  Patient's daughter made an appointment with you on 02/04/12. Patient's daughter wasn't sure if you wanted to call her to discuss it or make an appointment.  If she doesn't hear from you,she'll keep the appointment on 02/04/12.

## 2012-01-30 NOTE — Telephone Encounter (Signed)
[facility] 

## 2012-01-30 NOTE — Telephone Encounter (Signed)
Yetta Glassman with [facility] [phone]; pt needs higher level of care. [place] daughter wants pt admitted to [facility]; [facility] requires hospitalization due to pts SOB,wheeping edema,legs breakdown and lt heel blister forming.Cassandra wants to expedite process for admission.Please advise.

## 2012-01-30 NOTE — Telephone Encounter (Signed)
Please clarify the target hospital.

## 2012-01-31 ENCOUNTER — Other Ambulatory Visit: Payer: Self-pay | Admitting: *Deleted

## 2012-01-31 NOTE — Telephone Encounter (Signed)
[facility]ed refill [facility] from [facility].  Last filled 12/09/11.

## 2012-01-31 NOTE — Telephone Encounter (Signed)
I spoke with Lenise Herald (daughter) and she has not heard anything from the hospital.  Do we need to do something more?

## 2012-01-31 NOTE — Telephone Encounter (Signed)
Fax sent back to pharmacy, with instructions as below.

## 2012-01-31 NOTE — Telephone Encounter (Signed)
Notify them that this should come through her eye clinic.  Thanks.

## 2012-01-31 NOTE — Telephone Encounter (Signed)
I got a call from the hospital last night.  They are going to check with hospice and then notify the family.

## 2012-02-04 ENCOUNTER — Ambulatory Visit: Payer: Medicare Other | Admitting: Family Medicine

## 2012-02-05 ENCOUNTER — Other Ambulatory Visit: Payer: Self-pay | Admitting: *Deleted

## 2012-02-05 MED ORDER — BETA CAROTENE 25000 UNITS PO CAPS: 25000.0000 [IU] | ORAL_CAPSULE | ORAL | Status: AC

## 2012-02-06 ENCOUNTER — Other Ambulatory Visit: Payer: Self-pay | Admitting: Family Medicine

## 2012-02-06 MED ORDER — DIPHENOXYLATE-ATROPINE 2.5-0.025 MG PO TABS: ORAL_TABLET | ORAL | Status: AC

## 2012-02-17 ENCOUNTER — Telehealth: Payer: Self-pay | Admitting: Family Medicine

## 2012-02-18 ENCOUNTER — Telehealth: Payer: Self-pay | Admitting: Family Medicine

## 2012-02-18 NOTE — Telephone Encounter (Signed)
Attempted call again, no answer.

## 2012-02-18 NOTE — Telephone Encounter (Signed)
Talked with her daughter.  Support offered.  She thanked me for the call.

## 2012-03-09 NOTE — Telephone Encounter (Signed)
I called and LMOVM for family.  Will try to call again later.

## 2012-03-09 NOTE — Telephone Encounter (Signed)
Patient's daughter called to let Dr.Duncan and Lugene know that patient passed away this morning at [facility].

## 2012-03-09 DEATH — deceased

## 2014-01-09 IMAGING — CR DG CHEST 1V
1 series · 1 of 1 positions shown · non-contrast
Comparison: none

REASON FOR EXAM: right rib pain
COMMENTS:

PROCEDURE:     DXR - DXR CHEST 1 VIEWAP OR PA  - December 18, 2011  [DATE]
RESULT:     Comparison: None.

[t chest supine]
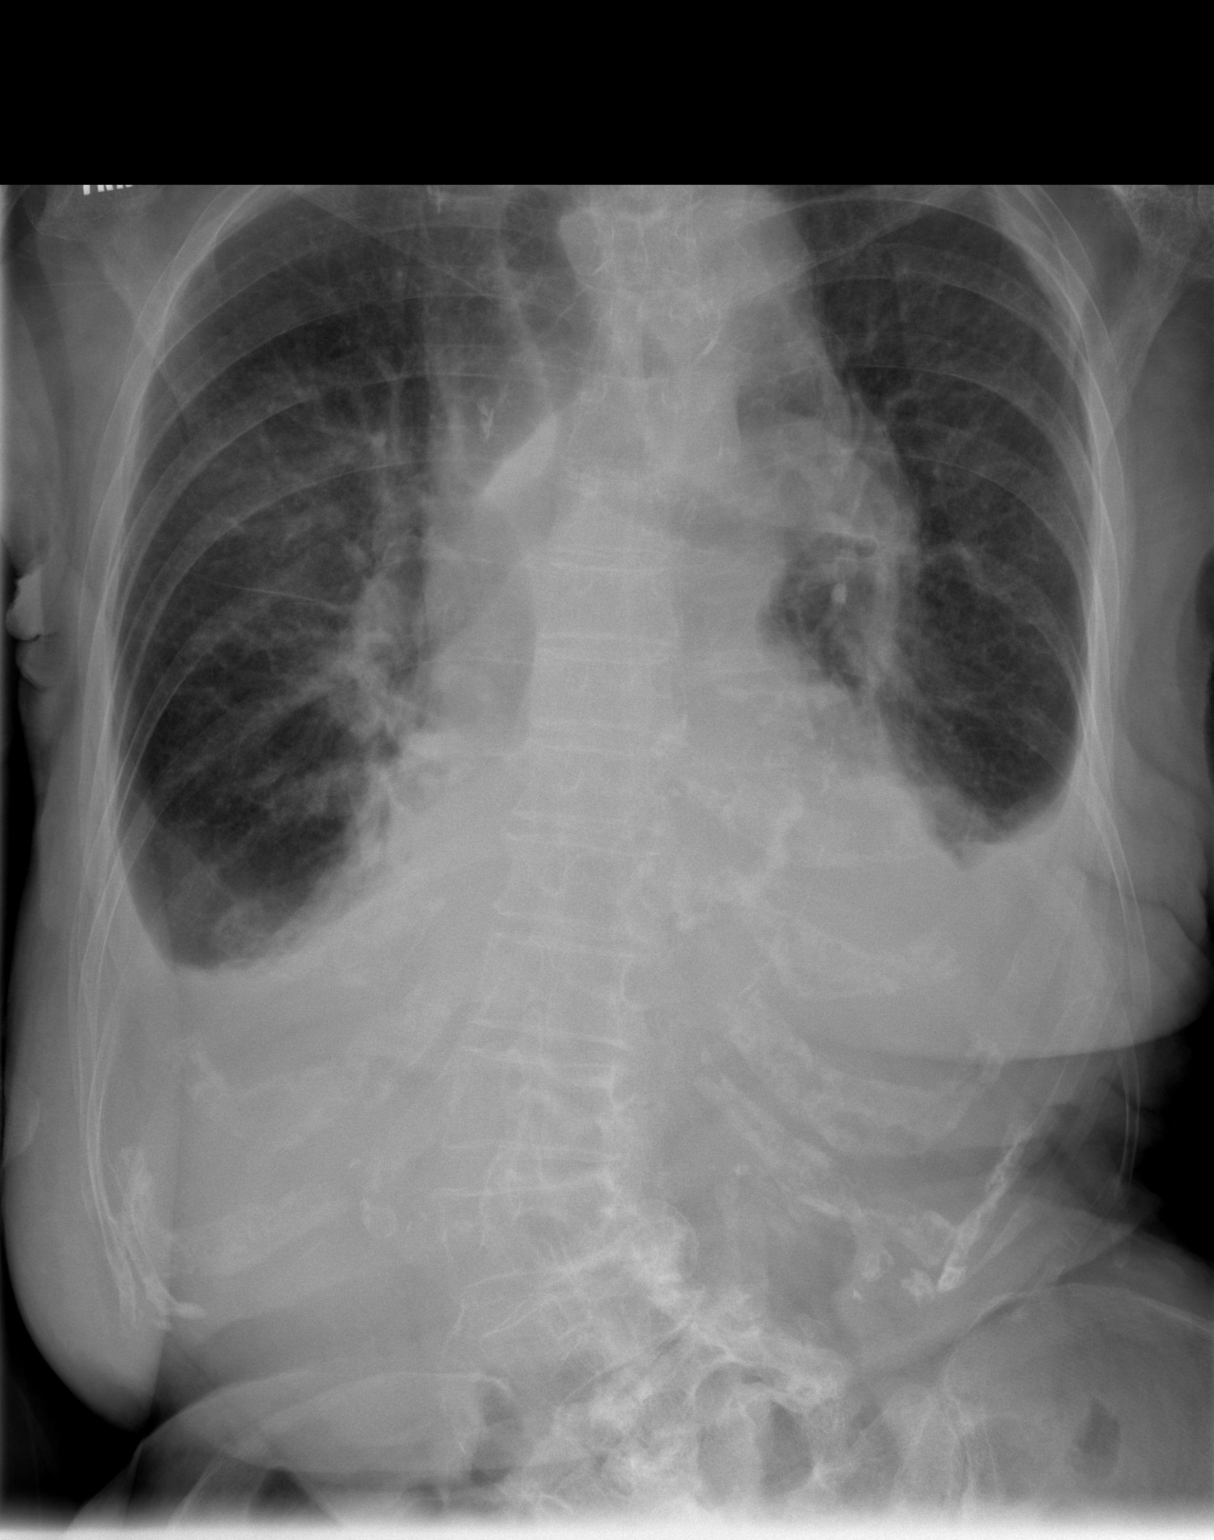

[1 of 1 positions shown; findings below may reference images not displayed]

FINDINGS: Heart size upper limits normal. The thoracic aorta is enlarged and tortuous.
There bilateral interstitial pulmonary opacities. There is prominence of the
mid and superior mediastinum, which may be related to the tortuous aorta.
There are small bilateral pleural effusions. There is mild biapical
pleuroparenchymal thickening.
IMPRESSION: 1. Findings which likely represent interstitial pulmonary edema, with small
bilateral pleural effusions.
2. Prominence of the mid and superior mediastinal likely secondary to the
enlarged, tortuous aorta. Followup PA and lateral radiographs are
recommended.

## 2014-05-03 NOTE — Telephone Encounter (Signed)
This encounter was created in error - please disregard.
